# Patient Record
Sex: Female | Born: 1960
Health system: Southern US, Community
[De-identification: ages and names within clinical notes are randomized; demographics above are authoritative.]

## PROBLEM LIST (undated history)

## (undated) DIAGNOSIS — M1990 Unspecified osteoarthritis, unspecified site: Secondary | ICD-10-CM

## (undated) DIAGNOSIS — R1011 Right upper quadrant pain: Secondary | ICD-10-CM

## (undated) DIAGNOSIS — E785 Hyperlipidemia, unspecified: Secondary | ICD-10-CM

## (undated) DIAGNOSIS — T7840XA Allergy, unspecified, initial encounter: Secondary | ICD-10-CM

## (undated) DIAGNOSIS — H269 Unspecified cataract: Secondary | ICD-10-CM

## (undated) DIAGNOSIS — K589 Irritable bowel syndrome without diarrhea: Secondary | ICD-10-CM

## (undated) DIAGNOSIS — I10 Essential (primary) hypertension: Secondary | ICD-10-CM

## (undated) HISTORY — PX: TONSILLECTOMY: SUR1361

## (undated) HISTORY — DX: Essential (primary) hypertension: I10

## (undated) HISTORY — PX: POLYPECTOMY: SHX149

## (undated) HISTORY — PX: DILATION AND CURETTAGE OF UTERUS: SHX78

## (undated) HISTORY — DX: Unspecified osteoarthritis, unspecified site: M19.90

## (undated) HISTORY — DX: Hyperlipidemia, unspecified: E78.5

## (undated) HISTORY — DX: Irritable bowel syndrome, unspecified: K58.9

## (undated) HISTORY — PX: WISDOM TOOTH EXTRACTION: SHX21

## (undated) HISTORY — DX: Allergy, unspecified, initial encounter: T78.40XA

## (undated) HISTORY — PX: UPPER GASTROINTESTINAL ENDOSCOPY: SHX188

## (undated) HISTORY — DX: Right upper quadrant pain: R10.11

## (undated) HISTORY — DX: Unspecified cataract: H26.9

---

## 1997-11-20 ENCOUNTER — Ambulatory Visit (HOSPITAL_COMMUNITY): Admission: RE | Admit: 1997-11-20 | Discharge: 1997-11-20 | Payer: Self-pay | Admitting: Obstetrics and Gynecology

## 1998-04-29 ENCOUNTER — Other Ambulatory Visit: Admission: RE | Admit: 1998-04-29 | Discharge: 1998-04-29 | Payer: Self-pay | Admitting: Obstetrics and Gynecology

## 1999-09-24 ENCOUNTER — Other Ambulatory Visit: Admission: RE | Admit: 1999-09-24 | Discharge: 1999-09-24 | Payer: Self-pay | Admitting: Obstetrics and Gynecology

## 2000-03-11 ENCOUNTER — Encounter: Admission: RE | Admit: 2000-03-11 | Discharge: 2000-03-11 | Payer: Self-pay | Admitting: Gastroenterology

## 2000-03-11 ENCOUNTER — Encounter: Payer: Self-pay | Admitting: Gastroenterology

## 2000-03-11 ENCOUNTER — Encounter (INDEPENDENT_AMBULATORY_CARE_PROVIDER_SITE_OTHER): Payer: Self-pay | Admitting: *Deleted

## 2000-06-18 ENCOUNTER — Encounter: Admission: RE | Admit: 2000-06-18 | Discharge: 2000-06-18 | Payer: Self-pay | Admitting: Gastroenterology

## 2000-06-18 ENCOUNTER — Encounter (INDEPENDENT_AMBULATORY_CARE_PROVIDER_SITE_OTHER): Payer: Self-pay | Admitting: *Deleted

## 2000-06-18 ENCOUNTER — Encounter: Payer: Self-pay | Admitting: Gastroenterology

## 2000-08-04 ENCOUNTER — Encounter: Admission: RE | Admit: 2000-08-04 | Discharge: 2000-08-04 | Payer: Self-pay | Admitting: Gastroenterology

## 2000-08-04 ENCOUNTER — Encounter: Payer: Self-pay | Admitting: Gastroenterology

## 2001-08-24 ENCOUNTER — Ambulatory Visit (HOSPITAL_COMMUNITY): Admission: RE | Admit: 2001-08-24 | Discharge: 2001-08-24 | Payer: Self-pay | Admitting: Internal Medicine

## 2001-08-24 ENCOUNTER — Encounter: Payer: Self-pay | Admitting: Internal Medicine

## 2001-08-24 ENCOUNTER — Encounter (INDEPENDENT_AMBULATORY_CARE_PROVIDER_SITE_OTHER): Payer: Self-pay | Admitting: *Deleted

## 2003-10-25 ENCOUNTER — Other Ambulatory Visit: Admission: RE | Admit: 2003-10-25 | Discharge: 2003-10-25 | Payer: Self-pay | Admitting: Internal Medicine

## 2003-11-06 ENCOUNTER — Emergency Department (HOSPITAL_COMMUNITY): Admission: EM | Admit: 2003-11-06 | Discharge: 2003-11-07 | Payer: Self-pay | Admitting: Emergency Medicine

## 2005-01-20 ENCOUNTER — Other Ambulatory Visit: Admission: RE | Admit: 2005-01-20 | Discharge: 2005-01-20 | Payer: Self-pay | Admitting: Internal Medicine

## 2005-09-08 ENCOUNTER — Encounter: Payer: Self-pay | Admitting: Internal Medicine

## 2005-09-15 ENCOUNTER — Encounter (INDEPENDENT_AMBULATORY_CARE_PROVIDER_SITE_OTHER): Payer: Self-pay | Admitting: *Deleted

## 2005-09-15 ENCOUNTER — Encounter: Admission: RE | Admit: 2005-09-15 | Discharge: 2005-09-15 | Payer: Self-pay | Admitting: Internal Medicine

## 2005-09-18 ENCOUNTER — Encounter: Payer: Self-pay | Admitting: Internal Medicine

## 2006-03-24 ENCOUNTER — Other Ambulatory Visit: Admission: RE | Admit: 2006-03-24 | Discharge: 2006-03-24 | Payer: Self-pay | Admitting: Obstetrics and Gynecology

## 2006-04-02 ENCOUNTER — Ambulatory Visit: Payer: Self-pay | Admitting: Internal Medicine

## 2006-04-06 ENCOUNTER — Ambulatory Visit: Payer: Self-pay | Admitting: Internal Medicine

## 2006-04-27 ENCOUNTER — Ambulatory Visit: Payer: Self-pay | Admitting: Internal Medicine

## 2007-08-04 ENCOUNTER — Ambulatory Visit: Payer: Self-pay | Admitting: Cardiology

## 2007-08-25 ENCOUNTER — Ambulatory Visit: Payer: Self-pay | Admitting: Cardiology

## 2008-06-04 ENCOUNTER — Ambulatory Visit: Payer: Self-pay | Admitting: Cardiology

## 2009-09-06 ENCOUNTER — Telehealth: Payer: Self-pay | Admitting: Internal Medicine

## 2009-09-09 ENCOUNTER — Encounter (INDEPENDENT_AMBULATORY_CARE_PROVIDER_SITE_OTHER): Payer: Self-pay | Admitting: *Deleted

## 2009-09-26 DIAGNOSIS — Z8639 Personal history of other endocrine, nutritional and metabolic disease: Secondary | ICD-10-CM

## 2009-09-26 DIAGNOSIS — K589 Irritable bowel syndrome without diarrhea: Secondary | ICD-10-CM | POA: Insufficient documentation

## 2009-09-26 DIAGNOSIS — Z862 Personal history of diseases of the blood and blood-forming organs and certain disorders involving the immune mechanism: Secondary | ICD-10-CM | POA: Insufficient documentation

## 2010-05-22 HISTORY — PX: NOVASURE ABLATION: SHX5394

## 2010-05-23 ENCOUNTER — Ambulatory Visit (HOSPITAL_COMMUNITY)
Admission: RE | Admit: 2010-05-23 | Discharge: 2010-05-23 | Payer: Self-pay | Source: Home / Self Care | Attending: Obstetrics and Gynecology | Admitting: Obstetrics and Gynecology

## 2010-06-08 HISTORY — PX: ESOPHAGOGASTRODUODENOSCOPY: SHX1529

## 2010-07-10 NOTE — Letter (Signed)
Summary: New Patient letter  [facility]  [address] [place], [place] [address]   Phone: [phone]  Fax: [phone]       09/09/2009 MRN: [id number]  Helen Cook [address] [place], [place]  [address]  Dear Ms. Arrazola,  Welcome to the Gastroenterology Division at [facility].    You are scheduled to see Dr. Lina Sar on 10-02-09 at 2pm,  on the 3rd floor at [facility], [address].  We ask that you try to arrive at our office 15 minutes prior to your appointment time to allow for check-in.  We would like you to complete the enclosed self-administered evaluation form prior to your visit and bring it with you on the day of your appointment.  We will review it with you.  Also, please bring a complete list of all your medications or, if you prefer, bring the medication bottles and we will list them.  Please bring your insurance card so that we may make a copy of it.  If your insurance requires a referral to see a specialist, please bring your referral form from your primary care physician.  Co-payments are due at the time of your visit and may be paid by cash, check or credit card.     Your office visit will consist of a consult with your physician (includes a physical exam), any laboratory testing he/she may order, scheduling of any necessary diagnostic testing (e.g. x-ray, ultrasound, CT-scan), and scheduling of a procedure (e.g. Endoscopy, Colonoscopy) if required.  Please allow enough time on your schedule to allow for any/all of these possibilities.    If you cannot keep your appointment, please call [phone] to cancel or reschedule prior to your appointment date.  This allows [place] the opportunity to schedule an appointment for another patient in need of care.  If you do not cancel or reschedule by 5 p.m. the business day prior to your appointment date, you will be charged a $50.00 late cancellation/no-show fee.    Thank you for  choosing  Gastroenterology for your medical needs.  We appreciate the opportunity to care for you.  Please visit [place] at our website  to learn more about our practice.                     Sincerely,                                                             The Gastroenterology Division   Appended Document: New Patient letter Letter mailed to patient.

## 2010-07-10 NOTE — Progress Notes (Signed)
Summary: Triage  Phone Note Call from Patient Call back at Home Phone [phone]   Caller: Patient Call For: Dr. Juanda Chance Reason for Call: Talk to Nurse Summary of Call: Pt had a colonoscopy done in the early 90's and wants to know if Dr. Juanda Chance would recommend a repeat colon Initial call taken by: Karna Christmas,  September 06, 2009 2:05 PM  Follow-up for Phone Call        Message left for patient to callback. Laureen Ochs LPN  September 07, 9526 3:34 PM  Pt. is having pressure with her BM's, is becomming more frequent. Had  a Colonoscopy in the 90's at [facility], doesn't remember results. Pt. will see Dr.Kesleigh Morson on 10-02-09 at 2pm, she will  get a copy of her Colonoscopy and bring with her. Pt. instructed to call back as needed.  Follow-up by: Laureen Ochs LPN,  September 09, 2009 9:38 AM     Appended Document: Triage Pt. was "No Show" for above appt.

## 2010-08-18 LAB — CBC
HCT: 34.3 % — ABNORMAL LOW (ref 36.0–46.0)
MCH: 29.1 pg (ref 26.0–34.0)
MCV: 83.9 fL (ref 78.0–100.0)
Platelets: 163 10*3/uL (ref 150–400)
RDW: 12.4 % (ref 11.5–15.5)
WBC: 10.6 10*3/uL — ABNORMAL HIGH (ref 4.0–10.5)

## 2010-09-30 ENCOUNTER — Other Ambulatory Visit: Payer: Self-pay | Admitting: Gastroenterology

## 2010-10-21 NOTE — Assessment & Plan Note (Signed)
[facility]                            CARDIOLOGY OFFICE NOTE   JESSCIA, IMM                       MRN:          [id number]  DATE:06/04/2008                            DOB:          August 16, 1960    PRIMARY CARE PHYSICIAN:  Huel Cote, MD   REASON FOR PRESENTATION:  Evaluate the patient with chest discomfort.   HISTORY OF PRESENT ILLNESS:  I last saw the patient in March at the time  of her treadmill to evaluate chest discomfort.  She had negative  adequate result.  There was no indication that her discomfort was from a  cardiac etiology.  Since that time, she has continued to have sporadic  chest discomfort as described in the February note.  It does not have an  increased frequency or intensity.  It happens out of the blue.  Unfortunately, she never started exercising, in fact is exercising less  than she was previously, as her daughter and 2 young grandchildren moved  in with her and is taking quite bit of her time.  She still occasionally  describes some right arm tingling and also tingling in her right leg at  night.  However, since getting a new mattress this actually seems to  have improved.   PAST MEDICAL HISTORY:  Low HDL and tonsillectomy.   ALLERGIES:  CODEINE.   MEDICATIONS:  Primrose oil, B complex, and flaxseed oil.   REVIEW OF SYSTEMS:  As stated in the HPI and otherwise negative for  other systems.   PHYSICAL EXAMINATION:  GENERAL:  The patient is in no distress.  VITAL SIGNS:  Blood pressure 128/82, heart rate 88 and regular, weight  144 pounds, body mass index 24.  HEENT:  Eyelids unremarkable.  Pupils equal, round, and reactive to  light.  Fundi not visualized, oral mucosa unremarkable.  NECK:  No jugular venous distention at 45 degrees.  Carotid upstroke  brisk and symmetric.  No bruits and no thyromegaly.  LYMPHATICS:  No  cervical, axillary, or inguinal adenopathy.  LUNGS:  Clear to auscultation bilaterally.  BACK:  No costovertebral angle tenderness.  CHEST:  Unremarkable.  HEART:  PMI not displaced or sustained.  S1 and S2 within normal.  No  S3, no S4, no clicks, no rubs, and no murmurs.  ABDOMEN:  Flat, positive bowel sounds.  Normal in frequency and pitch.  No bruits, no rebound, no guarding, and no midline pulsatile mass, no  organomegaly.  EXTREMITIES:  2+ pulse, no edema.   EKG sinus rhythm, rate 88.  Axis within normal limits.  Intervals within  normal limits.  No acute ST-wave changes.   ASSESSMENT AND PLAN:  1. Chest discomfort.  This does not seem to have cardiac etiology.      Sporadic and not particularly, bothersome.  At this point, no      further cardiovascular testing is suggested.  2. Followup.  She can come back to this clinic as needed.     Rollene Rotunda, MD, [facility]  Electronically Signed    JH/MedQ  DD: 06/04/2008  DT: 06/05/2008  Job #: [id number]  cc:   Huel Cote, M.D.

## 2010-10-21 NOTE — Assessment & Plan Note (Signed)
[facility]                            CARDIOLOGY OFFICE NOTE   Helen Cook, Helen Cook                       MRN:          [id number]  DATE:08/04/2007                            DOB:          1961/04/21    PRIMARY CARE PHYSICIAN:  Helen Cook, M.D.   REASON FOR CONSULTATION:  Evaluate patient with chest discomfort.   HISTORY OF PRESENT ILLNESS:  The patient is a pleasant 50 year old white  female without prior cardiac history.  She noticed over several weeks,  chest heaviness.  This is with activity such as walking.  It is a 4/10  discomfort.  She will be walking at a brisk pace.  She notices it kind  of like an ache.  She was ignoring this.  She stopped what she was doing  and it would go away.  There was initially no jaw or arm discomfort.  However, more recently the last few times she has walked, she has had a  little tingling in her right arm.  She also notices the same discomfort  in her chest if she lies flat.  She does not report any shortness of  breath and has no PND or orthopnea.  She is not having any palpitations,  presyncope or syncope.  She is an active woman.  She does not notice  these symptoms otherwise at rest other than described above.  She saw  Dr. Senaida Cook who referred her for consultation.   PAST MEDICAL HISTORY:  She has no history of hypertension, diabetes or  hyperlipidemia.  There is a mention of a low HDL.   PAST SURGICAL HISTORY:  1. Childbirth.  2. Tonsillectomy.   ALLERGIES:  CODEINE.   MEDICATIONS:  Multivitamin, B stress vitamin.   SOCIAL HISTORY:  The patient takes care of rental property.  She is  married.  She had two children.  She never smoked cigarettes.  She does  not drink alcohol.   FAMILY HISTORY:  Noncontributory for early coronary artery disease.   REVIEW OF SYSTEMS:  Positive occasional headaches, some low back pain.  Negative for all other systems.   PHYSICAL EXAMINATION:  GENERAL  APPEARANCE:  The patient is well-  appearing and in no distress.  VITAL SIGNS:  Blood pressure 137/79, heart rate 92 and regular, weight  140 pounds, body mass index 24.  HEENT:  Eyelids are unremarkable. pupils equal and reactive to light.  Fundi not visualized.  Oral mucosa unremarkable.  NECK:  No jugular venous distension at 45 degrees, carotid upstroke  brisk and symmetric.  No bruits, no thyromegaly.  LYMPHATICS:  No cervical, axillary, inguinal adenopathy.  LUNGS:  Clear to auscultation bilaterally.  BACK:  No costovertebral angle tenderness.  CHEST:  Unremarkable.  HEART:  PMI not displaced or sustained, S1-S2 within normal.  No S3-S4,  no clicks, no rubs, no murmurs.  ABDOMEN:  Flat, positive bowel sounds, normal in frequency and pitch, no  bruits, no rebound, no guarding or midline pulsatile mass.  No  hepatomegaly or splenomegaly.  SKIN:  No rashes, no nodules.  EXTREMITIES:  2+ pulses throughout, no edema,  cyanosis or clubbing.  NEURO:  Oriented to person, place and time, cranial nerves II-XII  grossly intact, motor grossly intact throughout.   EKG:  Sinus rhythm, rate 98, axis within normal limits, intervals within  normal limits, no acute ST-T wave change.  There is early transition  lead V2.   ASSESSMENT/PLAN:  1. Chest discomfort:  The patient's chest, somewhat atypical.      However, there is some features that are slightly worrisome.  She      has no significant risk factors other than low HDL by report.  I do      not have these labs.  I think the pretest probability of      obstructive coronary disease is low.  However, screening with an      exercise treadmill test would be warranted.  This will allow me to      rule out obstructive coronary disease.  More importantly, I will be      able to risk stratify.  Most importantly, give her a prescription      for exercise since she is not doing this routinely.  She will come      back to have a POET (plain old  exercise treadmill).  2. Risk reduction:  I will check a lipid profile since it has been a      while.  She will have this when she comes back fasting.  3. Follow-up:  I will see her at the time of the stress test.     Helen Rotunda, MD, [facility]  Electronically Signed    JH/MedQ  DD: 08/04/2007  DT: 08/05/2007  Job #: [id number]   cc:   Helen Cook, M.D.

## 2010-10-21 NOTE — Procedures (Signed)
[facility]                              EXERCISE DESIRAE, MANCUSI                       MRN:          [id number]  DATE:08/25/2007                            DOB:          Jan 04, 1961    PRIMARY CARE PHYSICIAN:  Huel Cote, M.D.   PROCEED:  Exercise treadmill test.   INDICATIONS:  Evaluate patient with chest pain.   PROCEDURE NOTE:  The patient was exercised using standard Bruce  protocol.  She was able to exercise for 10 minutes.  This was one minute  into stage IV.  Test was terminated because she achieved a target heart  rate and because of fatigue.  She had no chest pain.  She had  appropriate dyspnea.  Her peak heart rate was 164 which was 95% of  predicted.  She achieved 10.0 mets.  Repeat blood pressure was 191/85.  She had no arrhythmias.  No ischemic ST-T wave changes.  She had a  normal heart rate recovery.   CONCLUSION:  Negative adequate exercise treadmill test with reasonable  exercise tolerance.  There is no evidence for high-grade obstructive  coronary disease.   PLAN:  Based on the above, no further cardiovascular testing is  suggested.  I did discuss with the patient an exercise regimen based on  this.  We discussed diet as well.  No further follow-up is warranted  unless she has symptoms going forward.     Rollene Rotunda, MD, [facility]  Electronically Signed    JH/MedQ  DD: 08/25/2007  DT: 08/26/2007  Job #: [id number]   cc:   Huel Cote, M.D.

## 2010-10-24 NOTE — Assessment & Plan Note (Signed)
[facility]                           GASTROENTEROLOGY OFFICE NOTE   Helen Cook, Helen Cook                       MRN:          [id number]  DATE:04/02/2006                            DOB:          02-29-1961    Helen Cook is a very nice 50 year old black female who is for evaluation  of abnormal liver function tests.  On September 08, 2005, her AST was 45, ALT was  78 with normal alkaline phosphatase and serum albumin.  On September 18, 2005,  AST was 51, ALT 73 with normal alkaline phosphatase.  Her hepatitis A, B, C  antibodies and serologies have been negative.  Patient gives a history of  hemochromatosis in her family.  Her mother's mother's brother and three  sisters had hemachromatosis.  Patient's mother herself has never been tested  for such.  Helen Cook herself has been having right upper quadrant  abdominal pain.  She has never received blood transfusions and has never  been a blood donor.  She does not drink any alcohol.  She denies any history  of jaundice.  She denies easy bruising or swelling in her feet, but her  level of energy has been somewhat decreased.  She has a history of irritable  bowel syndrome.  About a year ago, she discontinued all of her multivitamins  and iron because of her concern for hemachromatosis.  She is currently on no  medication.   PAST MEDICAL HISTORY:  Significant for irritable bowel syndrome.   OPERATIONS:  None.   FAMILY HISTORY:  As mentioned in the history of present illness.  Diabetes  in grandparents.  Breast cancer, mother's side.   SOCIAL HISTORY:  She is married.  Has two children.  She does not smoke,  does not drink alcohol.   REVIEW OF SYSTEMS:  Positive for eye glasses.  Pain with periods.  Leakage  of urine.   PHYSICAL EXAMINATION:  VITAL SIGNS:  Blood pressure 118/62, pulse 96.  Weight 136 pounds.  GENERAL:  She was alert and oriented in no distress.  HEENT:  Sclerae are anicteric.  No  palmar erythema or Dupuytren  contractures.  No spider nevi.  NECK:  Supple without adenopathy.  LUNGS:  Clear to auscultation.  COR:  Normal S1 and S2.  There was a 2/6 systolic ejection murmur not  radiating to the carotids.  ABDOMEN:  Soft with tenderness in the subxiphoid area.  Liver edge was at  the costal margin.  By percussion was about 8-9 cm in span.  No  hepatomegaly.  No splenomegaly.  No ascites.  No venous distention of the  abdominal veins.  The lower abdomen was normal.  EXTREMITIES:  No edema.  NEUROLOGIC:  No asterixis.   IMPRESSION:  A 50 year old white female with a strong family history of  hemachromatosis, now with mild elevation of transaminases.  She had a pelvic  ultrasound and was told to have fatty liver, but the official report of the  pelvic ultrasound does not mention any liver appearance.   PLAN:  1. Upper abdominal ultrasound with attention to the liver.  2. Serum ferritin, TIBC and serum iron levels.  Alpha fetoprotein.  HLA      typing for hemochromatosis.  We will also obtain the prothrombin time.      After all this is done, patient will come back to discuss with [place].     Helen Morton. Juanda Chance, MD    DMB/MedQ  DD: 04/02/2006  DT: 04/03/2006  Job #: [id number]   cc:   Marcene Duos, M.D.

## 2011-01-01 ENCOUNTER — Other Ambulatory Visit: Payer: Self-pay | Admitting: Gastroenterology

## 2011-01-01 DIAGNOSIS — R1011 Right upper quadrant pain: Secondary | ICD-10-CM

## 2011-01-02 ENCOUNTER — Other Ambulatory Visit: Payer: Self-pay

## 2011-01-05 ENCOUNTER — Other Ambulatory Visit: Payer: Self-pay

## 2011-01-06 ENCOUNTER — Ambulatory Visit
Admission: RE | Admit: 2011-01-06 | Discharge: 2011-01-06 | Disposition: A | Discharge status: Home / Self Care | Payer: 59 | Source: Ambulatory Visit | Attending: Gastroenterology | Admitting: Gastroenterology

## 2011-01-06 DIAGNOSIS — R1011 Right upper quadrant pain: Secondary | ICD-10-CM

## 2011-02-25 ENCOUNTER — Other Ambulatory Visit (HOSPITAL_COMMUNITY): Payer: Self-pay | Admitting: Gastroenterology

## 2011-03-12 ENCOUNTER — Other Ambulatory Visit (HOSPITAL_COMMUNITY): Payer: 59

## 2011-03-24 ENCOUNTER — Encounter (HOSPITAL_COMMUNITY)
Admission: RE | Admit: 2011-03-24 | Discharge: 2011-03-24 | Disposition: A | Discharge status: Home / Self Care | Payer: 59 | Source: Ambulatory Visit | Attending: Gastroenterology | Admitting: Gastroenterology

## 2011-03-24 DIAGNOSIS — R109 Unspecified abdominal pain: Secondary | ICD-10-CM | POA: Insufficient documentation

## 2011-03-24 MED ORDER — TECHNETIUM TC 99M MEBROFENIN IV KIT
5.0000 | PACK | Freq: Once | INTRAVENOUS | Status: AC | PRN
  Administered 2011-03-24: 5 via INTRAVENOUS

## 2011-03-31 ENCOUNTER — Ambulatory Visit (INDEPENDENT_AMBULATORY_CARE_PROVIDER_SITE_OTHER): Payer: 59 | Admitting: General Surgery

## 2011-03-31 ENCOUNTER — Encounter (INDEPENDENT_AMBULATORY_CARE_PROVIDER_SITE_OTHER): Payer: Self-pay | Admitting: General Surgery

## 2011-03-31 VITALS — BP 112/68 | HR 80 | Temp 97.1°F | Resp 20 | Ht 64.0 in | Wt 134.0 lb

## 2011-03-31 DIAGNOSIS — R1011 Right upper quadrant pain: Secondary | ICD-10-CM

## 2011-03-31 NOTE — Patient Instructions (Signed)
If you have any question or would like to schedule the operation please call back.

## 2011-03-31 NOTE — Progress Notes (Signed)
Chief Complaint  Patient presents with  . New Evaluation    new pt eval of RUQ pain     HPI Helen Cook is a 50 y.o. female.  HPI She is referred by Dr. Dulce Sellar for possible cholecystectomy.  She has been having intermittent RUQ abdominal pain for about 6 months.  She has some nausea and has had one episode of vomiting.  Upper endoscopy was unrevealing.  Abdominal US demostrated no gallstones and finding suspicious for steatosis.  NM hepatobiliary scan demonstrated a GBEF of 68% which is within normal limits.  There are no specific exacerbating events.  She does have irritable bowel syndrome. Past Medical History  Diagnosis Date  . Abdominal pain, RUQ     RUQ and also radiates to the center of abdomen   . IBS (irritable bowel syndrome)     Past Surgical History  Procedure Date  . Novasure ablation 05/22/10  . Tonsillectomy   . Wisdom tooth extraction     History reviewed. No pertinent family history.  Social History History  Substance Use Topics  . Smoking status: Never Smoker   . Smokeless tobacco: Never Used  . Alcohol Use: No    Allergies  Allergen Reactions  . Codeine     Hives/ itching red bumps  . Penicillins     Current Outpatient Prescriptions  Medication Sig Dispense Refill  . Calcium Carbonate-Vitamin D (CALCIUM + D PO) Take by mouth daily.        . Multiple Vitamin (MULTI-VITAMIN DAILY PO) Take by mouth daily.          Review of Systems Review of Systems  Blood pressure 112/68, pulse 80, temperature 97.1 F (36.2 C), resp. rate 20, height 5\' 4"  (1.626 m), weight 134 lb (60.782 kg).  Physical Exam Physical Exam  Data Reviewed Labs (LFT nl), [place], NMHB scan, EGD, Dr. Hulen Shouts notes. Assessment    RUQ pain with normal gallbladder objective studies and IBS.  At times, her sxs are suggestive of gallbladder disease, although there is no way to predict if a cholecystectomy will improve her sxs.  This could possibly be an atypical manifestation of her  IBS.    Plan    I had a long discussion with her about this issue. We discussed the fact that a cholecystectomy could be diagnostic for diagnostic and therapeutic. However I could not give her probability of it relieving her symptoms.  I did discuss cholecystectomy with her and explained what the gallbladder did and gave her literature on this.  I have explained the procedure, risks, and aftercare of cholecystectomy.  Risks include but are not limited to bleeding, infection, wound problems, anesthesia, diarrhea, bile leak, injury to common bile duct/liver/intestine.  She seems to understand all of this.    She wants to go home and speak with her husband and I told her to call back if she is interested in scheduling the operation or if she had further questions.        Maylon Sailors J 03/31/2011, 2:07 PM

## 2012-03-18 ENCOUNTER — Ambulatory Visit: Payer: Self-pay

## 2014-11-02 ENCOUNTER — Encounter (HOSPITAL_COMMUNITY): Payer: Self-pay | Admitting: *Deleted

## 2014-11-02 ENCOUNTER — Emergency Department (HOSPITAL_COMMUNITY): Payer: 59

## 2014-11-02 ENCOUNTER — Emergency Department (HOSPITAL_COMMUNITY)
Admission: EM | Admit: 2014-11-02 | Discharge: 2014-11-03 | Disposition: A | Discharge status: Home / Self Care | Payer: 59 | Attending: Emergency Medicine | Admitting: Emergency Medicine

## 2014-11-02 DIAGNOSIS — K5732 Diverticulitis of large intestine without perforation or abscess without bleeding: Secondary | ICD-10-CM | POA: Insufficient documentation

## 2014-11-02 DIAGNOSIS — Z880 Allergy status to penicillin: Secondary | ICD-10-CM | POA: Diagnosis not present

## 2014-11-02 DIAGNOSIS — Z79899 Other long term (current) drug therapy: Secondary | ICD-10-CM | POA: Diagnosis not present

## 2014-11-02 DIAGNOSIS — R109 Unspecified abdominal pain: Secondary | ICD-10-CM

## 2014-11-02 DIAGNOSIS — N200 Calculus of kidney: Secondary | ICD-10-CM | POA: Diagnosis not present

## 2014-11-02 DIAGNOSIS — R319 Hematuria, unspecified: Secondary | ICD-10-CM | POA: Diagnosis present

## 2014-11-02 LAB — URINALYSIS, ROUTINE W REFLEX MICROSCOPIC
BILIRUBIN URINE: NEGATIVE
Glucose, UA: NEGATIVE mg/dL
Hgb urine dipstick: NEGATIVE
KETONES UR: NEGATIVE mg/dL
NITRITE: NEGATIVE
PROTEIN: NEGATIVE mg/dL
Specific Gravity, Urine: 1.017 (ref 1.005–1.030)
Urobilinogen, UA: 0.2 mg/dL (ref 0.0–1.0)
pH: 7 (ref 5.0–8.0)

## 2014-11-02 LAB — CBC WITH DIFFERENTIAL/PLATELET
Basophils Absolute: 0 10*3/uL (ref 0.0–0.1)
Basophils Relative: 0 % (ref 0–1)
EOS ABS: 0 10*3/uL (ref 0.0–0.7)
Eosinophils Relative: 1 % (ref 0–5)
HEMATOCRIT: 36.7 % (ref 36.0–46.0)
Hemoglobin: 12.4 g/dL (ref 12.0–15.0)
LYMPHS ABS: 2 10*3/uL (ref 0.7–4.0)
LYMPHS PCT: 23 % (ref 12–46)
MCH: 29.2 pg (ref 26.0–34.0)
MCHC: 33.8 g/dL (ref 30.0–36.0)
MCV: 86.6 fL (ref 78.0–100.0)
MONO ABS: 0.4 10*3/uL (ref 0.1–1.0)
Monocytes Relative: 5 % (ref 3–12)
NEUTROS ABS: 6.2 10*3/uL (ref 1.7–7.7)
Neutrophils Relative %: 71 % (ref 43–77)
Platelets: 181 10*3/uL (ref 150–400)
RBC: 4.24 MIL/uL (ref 3.87–5.11)
RDW: 12.4 % (ref 11.5–15.5)
WBC: 8.7 10*3/uL (ref 4.0–10.5)

## 2014-11-02 LAB — BASIC METABOLIC PANEL
Anion gap: 9 (ref 5–15)
BUN: 7 mg/dL (ref 6–20)
CHLORIDE: 104 mmol/L (ref 101–111)
CO2: 28 mmol/L (ref 22–32)
Calcium: 9.4 mg/dL (ref 8.9–10.3)
Creatinine, Ser: 0.65 mg/dL (ref 0.44–1.00)
GFR calc non Af Amer: >60 mL/min (ref >60)
GLUCOSE: 118 mg/dL — AB (ref 65–99)
POTASSIUM: 3.9 mmol/L (ref 3.5–5.1)
Sodium: 141 mmol/L (ref 135–145)

## 2014-11-02 LAB — URINE MICROSCOPIC-ADD ON

## 2014-11-02 MED ORDER — CIPROFLOXACIN HCL 500 MG PO TABS
500.0000 mg | ORAL_TABLET | Freq: Once | ORAL | Status: AC
  Administered 2014-11-03: 500 mg via ORAL
  Filled 2014-11-02: qty 1

## 2014-11-02 MED ORDER — HYDROCODONE-ACETAMINOPHEN 5-325 MG PO TABS: 1.0000 | ORAL_TABLET | Freq: Four times a day (QID) | ORAL | Status: DC | PRN

## 2014-11-02 MED ORDER — METRONIDAZOLE 500 MG PO TABS: 500.0000 mg | ORAL_TABLET | Freq: Three times a day (TID) | ORAL | Status: DC

## 2014-11-02 MED ORDER — CIPROFLOXACIN HCL 500 MG PO TABS: 500.0000 mg | ORAL_TABLET | Freq: Two times a day (BID) | ORAL | Status: DC

## 2014-11-02 MED ORDER — IBUPROFEN 400 MG PO TABS
400.0000 mg | ORAL_TABLET | Freq: Once | ORAL | Status: AC
  Administered 2014-11-03: 400 mg via ORAL
  Filled 2014-11-02: qty 1

## 2014-11-02 MED ORDER — METRONIDAZOLE 500 MG PO TABS
500.0000 mg | ORAL_TABLET | Freq: Once | ORAL | Status: AC
  Administered 2014-11-03: 500 mg via ORAL
  Filled 2014-11-02: qty 1

## 2014-11-02 NOTE — ED Notes (Signed)
To ct

## 2014-11-02 NOTE — ED Notes (Signed)
States she is hurting in the lower abd and lower back

## 2014-11-02 NOTE — ED Notes (Signed)
The pt ius here with abd pain and painful urination while at work.  She was seen by ob doctor and was told she did not have a uti.  Mire pain tonight with some bleeding

## 2014-11-02 NOTE — Discharge Instructions (Signed)
It was our pleasure to provide your ER care today - we hope that you feel better.  Your ct scan was read as follows: IMPRESSION: 1. Uncomplicated diverticulitis at the descending sigmoid junction. 2. Possible cystitis. Correlate with urinalysis. 3. Numerous small bilateral renal calculi.  For diverticulitis, take antibiotics (cipro and flagyl) as prescribed.  Do not drink alcohol when taking flagyl.  Take motrin or aleve as need for pain. You may also take hydrocodone as need for pain. No driving when taking hydrocodone. Also, do not take tylenol or acetaminophen containing medication when taking hydrocodone.  Follow up with urologist as planned.  Return to ER if worse, new symptoms, severe pain, persistent vomiting, high fevers, weak/faint, other concern.       Diverticulitis Diverticulitis is inflammation or infection of small pouches in your colon that form when you have a condition called diverticulosis. The pouches in your colon are called diverticula. Your colon, or large intestine, is where water is absorbed and stool is formed. Complications of diverticulitis can include:  Bleeding.  Severe infection.  Severe pain.  Perforation of your colon.  Obstruction of your colon. CAUSES  Diverticulitis is caused by bacteria. Diverticulitis happens when stool becomes trapped in diverticula. This allows bacteria to grow in the diverticula, which can lead to inflammation and infection. RISK FACTORS People with diverticulosis are at risk for diverticulitis. Eating a diet that does not include enough fiber from fruits and vegetables may make diverticulitis more likely to develop. SYMPTOMS  Symptoms of diverticulitis may include:  Abdominal pain and tenderness. The pain is normally located on the left side of the abdomen, but may occur in other areas.  Fever and chills.  Bloating.  Cramping.  Nausea.  Vomiting.  Constipation.  Diarrhea.  Blood in your  stool. DIAGNOSIS  Your health care provider will ask you about your medical history and do a physical exam. You may need to have tests done because many medical conditions can cause the same symptoms as diverticulitis. Tests may include:  Blood tests.  Urine tests.  Imaging tests of the abdomen, including X-rays and CT scans. When your condition is under control, your health care provider may recommend that you have a colonoscopy. A colonoscopy can show how severe your diverticula are and whether something else is causing your symptoms. TREATMENT  Most cases of diverticulitis are mild and can be treated at home. Treatment may include:  Taking over-the-counter pain medicines.  Following a clear liquid diet.  Taking antibiotic medicines by mouth for 7-10 days. More severe cases may be treated at a hospital. Treatment may include:  Not eating or drinking.  Taking prescription pain medicine.  Receiving antibiotic medicines through an IV tube.  Receiving fluids and nutrition through an IV tube.  Surgery. HOME CARE INSTRUCTIONS   Follow your health care provider's instructions carefully.  Follow a full liquid diet or other diet as directed by your health care provider. After your symptoms improve, your health care provider may tell you to change your diet. He or she may recommend you eat a high-fiber diet. Fruits and vegetables are good sources of fiber. Fiber makes it easier to pass stool.  Take fiber supplements or probiotics as directed by your health care provider.  Only take medicines as directed by your health care provider.  Keep all your follow-up appointments. SEEK MEDICAL CARE IF:   Your pain does not improve.  You have a hard time eating food.  Your bowel movements do not return to  normal. SEEK IMMEDIATE MEDICAL CARE IF:   Your pain becomes worse.  Your symptoms do not get better.  Your symptoms suddenly get worse.  You have a fever.  You have repeated  vomiting.  You have bloody or black, tarry stools. MAKE SURE YOU:   Understand these instructions.  Will watch your condition.  Will get help right away if you are not doing well or get worse. Document Released: 03/04/2005 Document Revised: 05/30/2013 Document Reviewed: 04/19/2013 [facility] Patient Information 2015 [place], [place]. This information is not intended to replace advice given to you by your health care provider. Make sure you discuss any questions you have with your health care provider.     Kidney Stones Kidney stones (urolithiasis) are deposits that form inside your kidneys. The intense pain is caused by the stone moving through the urinary tract. When the stone moves, the ureter goes into spasm around the stone. The stone is usually passed in the urine.  CAUSES   A disorder that makes certain neck glands produce too much parathyroid hormone (primary hyperparathyroidism).  A buildup of uric acid crystals, similar to gout in your joints.  Narrowing (stricture) of the ureter.  A kidney obstruction present at birth (congenital obstruction).  Previous surgery on the kidney or ureters.  Numerous kidney infections. SYMPTOMS   Feeling sick to your stomach (nauseous).  Throwing up (vomiting).  Blood in the urine (hematuria).  Pain that usually spreads (radiates) to the groin.  Frequency or urgency of urination. DIAGNOSIS   Taking a history and physical exam.  Blood or urine tests.  CT scan.  Occasionally, an examination of the inside of the urinary bladder (cystoscopy) is performed. TREATMENT   Observation.  Increasing your fluid intake.  Extracorporeal shock wave lithotripsy--This is a noninvasive procedure that uses shock waves to break up kidney stones.  Surgery may be needed if you have severe pain or persistent obstruction. There are various surgical procedures. Most of the procedures are performed with the use of small instruments. Only small  incisions are needed to accommodate these instruments, so recovery time is minimized. The size, location, and chemical composition are all important variables that will determine the proper choice of action for you. Talk to your health care provider to better understand your situation so that you will minimize the risk of injury to yourself and your kidney.  HOME CARE INSTRUCTIONS   Drink enough water and fluids to keep your urine clear or pale yellow. This will help you to pass the stone or stone fragments.  Strain all urine through the provided strainer. Keep all particulate matter and stones for your health care provider to see. The stone causing the pain may be as small as a grain of salt. It is very important to use the strainer each and every time you pass your urine. The collection of your stone will allow your health care provider to analyze it and verify that a stone has actually passed. The stone analysis will often identify what you can do to reduce the incidence of recurrences.  Only take over-the-counter or prescription medicines for pain, discomfort, or fever as directed by your health care provider.  Make a follow-up appointment with your health care provider as directed.  Get follow-up X-rays if required. The absence of pain does not always mean that the stone has passed. It may have only stopped moving. If the urine remains completely obstructed, it can cause loss of kidney function or even complete destruction of the kidney.  It is your responsibility to make sure X-rays and follow-ups are completed. Ultrasounds of the kidney can show blockages and the status of the kidney. Ultrasounds are not associated with any radiation and can be performed easily in a matter of minutes. SEEK MEDICAL CARE IF:  You experience pain that is progressive and unresponsive to any pain medicine you have been prescribed. SEEK IMMEDIATE MEDICAL CARE IF:   Pain cannot be controlled with the prescribed  medicine.  You have a fever or shaking chills.  The severity or intensity of pain increases over 18 hours and is not relieved by pain medicine.  You develop a new onset of abdominal pain.  You feel faint or pass out.  You are unable to urinate. MAKE SURE YOU:   Understand these instructions.  Will watch your condition.  Will get help right away if you are not doing well or get worse. Document Released: 05/25/2005 Document Revised: 01/25/2013 Document Reviewed: 10/26/2012 [facility] Patient Information 2015 [place], [place]. This information is not intended to replace advice given to you by your health care provider. Make sure you discuss any questions you have with your health care provider.

## 2014-11-02 NOTE — ED Provider Notes (Addendum)
CSN: [id number]     Arrival date & time 11/02/14  1933 History   First MD Initiated Contact with Patient 11/02/14 2124     Chief Complaint  Patient presents with  . Hematuria     (Consider location/radiation/quality/duration/timing/severity/associated sxs/prior Treatment) Patient is a 54 y.o. female presenting with hematuria. The history is provided by the patient.  Hematuria Pertinent negatives include no chest pain, no abdominal pain, no headaches and no shortness of breath.  Patient c/o recent hematuria, and today acute onset pain left flank post urinating, urinary urgency.  Denies dysuria. No fever or chills. No nv. Normal appetite.  Pt indicates had recently seen her gyn doctor w hematuria.  States initially started on abx, but then called and told urine culture was negative and to stop abx.  Pt states was then referred to urology follow up.  No prior hx hematuria or kidney stones. Pt w acute onset flank pain at evening at work. Also c/o pain posteriorly. Denies hx same pain. Normal appetite, normal po intake. Has been having normal bms. Hx ibs, states current symptoms different. Pt states post menopausal, lnmp 4 yrs ago. Denies fever or chills. No vaginal discharge or bleeding.       Past Medical History  Diagnosis Date  . Abdominal pain, RUQ     RUQ and also radiates to the center of abdomen   . IBS (irritable bowel syndrome)    Past Surgical History  Procedure Laterality Date  . Novasure ablation  05/22/10  . Tonsillectomy    . Wisdom tooth extraction     No family history on file. History  Substance Use Topics  . Smoking status: Never Smoker   . Smokeless tobacco: Never Used  . Alcohol Use: No   OB History    No data available     Review of Systems  Constitutional: Negative for fever and chills.  HENT: Negative for sore throat.   Eyes: Negative for redness.  Respiratory: Negative for shortness of breath.   Cardiovascular: Negative for chest pain.   Gastrointestinal: Negative for vomiting, abdominal pain and diarrhea.  Endocrine: Negative for polyuria.  Genitourinary: Positive for hematuria and flank pain. Negative for dysuria.  Musculoskeletal: Positive for back pain. Negative for neck pain.  Skin: Negative for rash.  Neurological: Negative for headaches.  Hematological: Does not bruise/bleed easily.  Psychiatric/Behavioral: Negative for confusion.      Allergies  Codeine and Penicillins  Home Medications   Prior to Admission medications   Medication Sig Start Date End Date Taking? Authorizing Provider  Calcium Carbonate-Vitamin D (CALCIUM + D PO) Take 1 tablet by mouth daily.    Yes Historical Provider, MD  Multiple Vitamin (MULTI-VITAMIN DAILY PO) Take 1 tablet by mouth daily.    Yes Historical Provider, MD   BP 124/69 mmHg  Pulse 82  Temp(Src) 98.7 F (37.1 C) (Oral)  Resp 20  Ht 5\' 4"  (1.626 m)  Wt 130 lb (58.968 kg)  BMI 22.30 kg/m2  SpO2 100% Physical Exam  Constitutional: She appears well-developed and well-nourished. No distress.  HENT:  Mouth/Throat: Oropharynx is clear and moist.  Eyes: Conjunctivae are normal. No scleral icterus.  Neck: Neck supple. No tracheal deviation present.  Cardiovascular: Normal rate, regular rhythm, normal heart sounds and intact distal pulses.   Pulmonary/Chest: Effort normal and breath sounds normal. No respiratory distress.  Abdominal: Soft. Normal appearance and bowel sounds are normal. She exhibits no distension and no mass. There is tenderness. There is no rebound and  no guarding.  No hernia. Mild llq tenderness.   Genitourinary:  No cva tenderness  Musculoskeletal: She exhibits no edema.  Neurological: She is alert.  Skin: Skin is warm and dry. No rash noted. She is not diaphoretic.  No shingles/rash in area of pain  Psychiatric: She has a normal mood and affect.  Nursing note and vitals reviewed.   ED Course  Procedures (including critical care time) Labs  Review   Results for orders placed or performed during the hospital encounter of 11/02/14  Urinalysis, Routine w reflex microscopic (not at [facility])  Result Value Ref Range   Color, Urine AMBER (A) YELLOW   APPearance CLEAR CLEAR   Specific Gravity, Urine 1.017 1.005 - 1.030   pH 7.0 5.0 - 8.0   Glucose, UA NEGATIVE NEGATIVE mg/dL   Hgb urine dipstick NEGATIVE NEGATIVE   Bilirubin Urine NEGATIVE NEGATIVE   Ketones, ur NEGATIVE NEGATIVE mg/dL   Protein, ur NEGATIVE NEGATIVE mg/dL   Urobilinogen, UA 0.2 0.0 - 1.0 mg/dL   Nitrite NEGATIVE NEGATIVE   Leukocytes, UA SMALL (A) NEGATIVE  CBC with Differential  Result Value Ref Range   WBC 8.7 4.0 - 10.5 K/uL   RBC 4.24 3.87 - 5.11 MIL/uL   Hemoglobin 12.4 12.0 - 15.0 g/dL   HCT 36.7 36.0 - 46.0 %   MCV 86.6 78.0 - 100.0 fL   MCH 29.2 26.0 - 34.0 pg   MCHC 33.8 30.0 - 36.0 g/dL   RDW 12.4 11.5 - 15.5 %   Platelets 181 150 - 400 K/uL   Neutrophils Relative % 71 43 - 77 %   Neutro Abs 6.2 1.7 - 7.7 K/uL   Lymphocytes Relative 23 12 - 46 %   Lymphs Abs 2.0 0.7 - 4.0 K/uL   Monocytes Relative 5 3 - 12 %   Monocytes Absolute 0.4 0.1 - 1.0 K/uL   Eosinophils Relative 1 0 - 5 %   Eosinophils Absolute 0.0 0.0 - 0.7 K/uL   Basophils Relative 0 0 - 1 %   Basophils Absolute 0.0 0.0 - 0.1 K/uL  Basic metabolic panel  Result Value Ref Range   Sodium 141 135 - 145 mmol/L   Potassium 3.9 3.5 - 5.1 mmol/L   Chloride 104 101 - 111 mmol/L   CO2 28 22 - 32 mmol/L   Glucose, Bld 118 (H) 65 - 99 mg/dL   BUN 7 6 - 20 mg/dL   Creatinine, Ser 0.65 0.44 - 1.00 mg/dL   Calcium 9.4 8.9 - 10.3 mg/dL   GFR calc non Af Amer >60 >60 mL/min   GFR calc Af Amer >60 >60 mL/min   Anion gap 9 5 - 15  Urine microscopic-add on  Result Value Ref Range   Squamous Epithelial / LPF RARE RARE   WBC, UA 3-6 <3 WBC/hpf   Bacteria, UA FEW (A) RARE   Urine-Other MUCOUS PRESENT    Ct Abdomen Pelvis Wo Contrast  11/02/2014   CLINICAL DATA:  Groin and lower back pain  for 2 months  EXAM: CT ABDOMEN AND PELVIS WITHOUT CONTRAST  TECHNIQUE: Multidetector CT imaging of the abdomen and pelvis was performed following the standard protocol without IV contrast.  COMPARISON:  None.  FINDINGS: BODY WALL: No contributory findings.  LOWER CHEST: No contributory findings.  ABDOMEN/PELVIS:  Liver: No focal abnormality.  Biliary: No evidence of biliary obstruction or stone.  Pancreas: Unremarkable.  Spleen: Unremarkable.  Adrenals: Unremarkable.  Kidneys and ureters: Numerous tiny stones throughout the bilateral  kidneys, nonobstructive. No ureteral calculus or hydronephrosis.  Bladder: There is a small bubble of gas in the bladder. Mild fat haziness around the bladder.  Reproductive: No pathologic findings.  Bowel: Inflammation surrounds a prominent descending sigmoid junction diverticulum. Colonic diverticulosis is moderate for age -mainly distal. No bowel obstruction. No close proximity of the diverticulitis to the bladder to relate bladder gas to the inflammation. No acute appendicitis.  Retroperitoneum: No mass or adenopathy.  Peritoneum: No ascites or pneumoperitoneum.  Vascular: No acute abnormality.  OSSEOUS: No acute abnormalities.  IMPRESSION: 1. Uncomplicated diverticulitis at the descending sigmoid junction. 2. Possible cystitis.  Correlate with urinalysis. 3. Numerous small bilateral renal calculi.   Electronically Signed   By: Monte Fantasia M.D.   On: 11/02/2014 23:41      MDM   Iv ns. Labs.  Reviewed nursing notes and prior charts for additional history.   Given r/o hematuria, and now flank pain, urgency, will get ct r/o ureteral stone.   Abd/pelvis exam is soft and non tender. Afeb.  Ct w mild diverticulitis. ua w small le and 3-6 wbc - cx sent.   Will rx cipro and flagl - dose given in ED.  Pt drove self to ed, motrin po.   Recheck pt comfortable. No nv. Afeb. Informed of ct results.  Pt currently appears stable for d/c.  Return precautions  provided.      Lajean Saver, MD 11/02/14 [phone]

## 2014-11-02 NOTE — ED Notes (Signed)
Patient presents stating about 2 months ago she noticed blood when she voided.  Followed up with her PMD and was told to follow up with a urologist

## 2014-11-03 NOTE — ED Notes (Signed)
Pt has had  Discharge oirders since 0000  Pt upset  About the wait to be discharged i was tied up with a post cpr that just got upsrairs.  noi d/c vitals pt just wanted to leave she is working this am

## 2014-11-04 LAB — URINE CULTURE: Colony Count: 8000

## 2015-01-25 ENCOUNTER — Ambulatory Visit: Payer: 59 | Admitting: Urology

## 2015-03-29 ENCOUNTER — Ambulatory Visit (INDEPENDENT_AMBULATORY_CARE_PROVIDER_SITE_OTHER): Payer: 59 | Admitting: Urology

## 2015-03-29 DIAGNOSIS — N200 Calculus of kidney: Secondary | ICD-10-CM | POA: Diagnosis not present

## 2016-06-08 HISTORY — PX: COLONOSCOPY: SHX174

## 2016-06-11 ENCOUNTER — Ambulatory Visit (INDEPENDENT_AMBULATORY_CARE_PROVIDER_SITE_OTHER): Payer: Commercial Managed Care - HMO | Admitting: Family Medicine

## 2016-06-11 VITALS — BP 110/72 | HR 75 | Temp 98.3°F | Resp 18 | Ht 64.0 in | Wt 134.0 lb

## 2016-06-11 DIAGNOSIS — J069 Acute upper respiratory infection, unspecified: Secondary | ICD-10-CM

## 2016-06-11 DIAGNOSIS — H6593 Unspecified nonsuppurative otitis media, bilateral: Secondary | ICD-10-CM | POA: Diagnosis not present

## 2016-06-11 MED ORDER — PREDNISONE 20 MG PO TABS: 20.0000 mg | ORAL_TABLET | Freq: Every day | ORAL | 0 refills | Status: DC

## 2016-06-11 MED ORDER — IPRATROPIUM BROMIDE 0.03 % NA SOLN: 2.0000 | Freq: Two times a day (BID) | NASAL | 0 refills | Status: DC

## 2016-06-11 NOTE — Progress Notes (Signed)
Patient ID: Helen Cook, female    DOB: 20-Oct-1960, 55 y.o.   MRN: [id number]  PCP: No PCP Per Patient  Chief Complaint  Patient presents with  . Sore Throat  . chest congestion    Subjective:  HPI  56 year old female presents for evaluation of chest congestion and sore throat times 2 days. She has no significant past medical hx. Reports nasal congestion, feverish last night  Minimal non-productive cough.Throat feels very dry. Ear fullness, bilaterally. Generalized body aches. She has taken Nyquil and Aleve with only minimal relief of symptoms.   Social History   Social History  . Marital status: Married    Spouse name: N/A  . Number of children: N/A  . Years of education: N/A   Occupational History  . Not on file.   Social History Main Topics  . Smoking status: Never Smoker  . Smokeless tobacco: Never Used  . Alcohol use No  . Drug use: No  . Sexual activity: Not on file   Other Topics Concern  . Not on file   Social History Narrative  . No narrative on file   Review of Systems See HPI  Patient Active Problem List   Diagnosis Date Noted  . IRRITABLE BOWEL SYNDROME 09/26/2009  . LIVER FUNCTION TESTS, ABNORMAL, HX OF 09/26/2009    Allergies  Allergen Reactions  . Codeine     Hives/ itching red bumps  . Penicillins     Swelling around the ears and eyes     Prior to Admission medications   Medication Sig Start Date End Date Taking? Authorizing Provider  Calcium Carbonate-Vitamin D (CALCIUM + D PO) Take 1 tablet by mouth daily.     Historical Provider, MD  HYDROcodone-acetaminophen (NORCO/VICODIN) 5-325 MG per tablet Take 1-2 tablets by mouth every 6 (six) hours as needed for moderate pain. Patient not taking: Reported on 06/11/2016 11/02/14   Lajean Saver, MD    Past Medical, Surgical Family and Social History reviewed and updated.    Objective:   Today's Vitals   06/11/16 1059  BP: 110/72  Pulse: 75  Resp: 18  Temp: 98.3 F (36.8 C)    TempSrc: Oral  SpO2: 97%  Weight: 134 lb (60.8 kg)  Height: 5\' 4"  (1.626 m)    Wt Readings from Last 3 Encounters:  06/11/16 134 lb (60.8 kg)  11/02/14 130 lb (59 kg)  03/31/11 134 lb (60.8 kg)   Physical Exam  Constitutional: She is oriented to person, place, and time. She appears well-developed and well-nourished.  HENT:  Head: Normocephalic and atraumatic.  Right Ear: Hearing normal. A middle ear effusion is present.  Left Ear: Hearing normal. A middle ear effusion is present.  Nose: Mucosal edema and rhinorrhea present.  Mouth/Throat: Uvula is midline and oropharynx is clear and moist.  Eyes: Conjunctivae are normal. Pupils are equal, round, and reactive to light.  Neck: Normal range of motion. Neck supple.  Cardiovascular: Normal rate, regular rhythm, normal heart sounds and intact distal pulses.   Pulmonary/Chest: Effort normal and breath sounds normal.  Musculoskeletal: Normal range of motion.  Neurological: She is alert and oriented to person, place, and time.  Skin: Skin is warm and dry.  Psychiatric: She has a normal mood and affect. Her behavior is normal. Judgment and thought content normal.       Assessment & Plan:  1. Acute upper respiratory infection 2. Middle ear effusion, bilateral Treat symptomatically for now- Plan: -Ipratropium (Atrovent) 0.03 %  nasal spray  -Prednisone (Deltasone) 20 mg daily with breakfast   Carroll Sage. Kenton Kingfisher, MSN, FNP-C [facility]

## 2016-06-11 NOTE — Patient Instructions (Addendum)
Start Prednisone 40 mg x five days with breakfast or lunch.  Start Atrovent nasal spray, 2 sprays per nares twice daily until ear fullness and nasal congestion resolves.  IF you received an x-ray today, you will receive an invoice from [facility]. Please contact [facility] at [phone] with questions or concerns regarding your invoice.   IF you received labwork today, you will receive an invoice from [facility]. Please contact [facility] at [phone] with questions or concerns regarding your invoice.   Our billing staff will not be able to assist you with questions regarding bills from these companies.  You will be contacted with the lab results as soon as they are available. The fastest way to get your results is to activate your My Chart account. Instructions are located on the last page of this paperwork. If you have not heard from [place] regarding the results in 2 weeks, please contact this office.      Upper Respiratory Infection, Adult Most upper respiratory infections (URIs) are caused by a virus. A URI affects the nose, throat, and upper air passages. The most common type of URI is often called "the common cold." Follow these instructions at home:  Take medicines only as told by your doctor.  Gargle warm saltwater or take cough drops to comfort your throat as told by your doctor.  Use a warm mist humidifier or inhale steam from a shower to increase air moisture. This may make it easier to breathe.  Drink enough fluid to keep your pee (urine) clear or pale yellow.  Eat soups and other clear broths.  Have a healthy diet.  Rest as needed.  Go back to work when your fever is gone or your doctor says it is okay.  You may need to stay home longer to avoid giving your URI to others.  You can also wear a face mask and wash your hands often to prevent spread of the virus.  Use your inhaler more if you have asthma.  Do not use any tobacco products, including  cigarettes, chewing tobacco, or electronic cigarettes. If you need help quitting, ask your doctor. Contact a doctor if:  You are getting worse, not better.  Your symptoms are not helped by medicine.  You have chills.  You are getting more short of breath.  You have brown or red mucus.  You have yellow or brown discharge from your nose.  You have pain in your face, especially when you bend forward.  You have a fever.  You have puffy (swollen) neck glands.  You have pain while swallowing.  You have white areas in the back of your throat. Get help right away if:  You have very bad or constant:  Headache.  Ear pain.  Pain in your forehead, behind your eyes, and over your cheekbones (sinus pain).  Chest pain.  You have long-lasting (chronic) lung disease and any of the following:  Wheezing.  Long-lasting cough.  Coughing up blood.  A change in your usual mucus.  You have a stiff neck.  You have changes in your:  Vision.  Hearing.  Thinking.  Mood. This information is not intended to replace advice given to you by your health care provider. Make sure you discuss any questions you have with your health care provider. Document Released: 11/11/2007 Document Revised: 01/26/2016 Document Reviewed: 08/30/2013 Elsevier Interactive Patient Education  2017 [employer].

## 2016-08-08 DIAGNOSIS — M5136 Other intervertebral disc degeneration, lumbar region: Secondary | ICD-10-CM | POA: Diagnosis not present

## 2016-08-08 DIAGNOSIS — M546 Pain in thoracic spine: Secondary | ICD-10-CM | POA: Diagnosis not present

## 2016-08-08 DIAGNOSIS — M5134 Other intervertebral disc degeneration, thoracic region: Secondary | ICD-10-CM | POA: Diagnosis not present

## 2016-09-16 DIAGNOSIS — M9905 Segmental and somatic dysfunction of pelvic region: Secondary | ICD-10-CM | POA: Diagnosis not present

## 2016-09-16 DIAGNOSIS — M9902 Segmental and somatic dysfunction of thoracic region: Secondary | ICD-10-CM | POA: Diagnosis not present

## 2016-09-16 DIAGNOSIS — M5408 Panniculitis affecting regions of neck and back, sacral and sacrococcygeal region: Secondary | ICD-10-CM | POA: Diagnosis not present

## 2016-09-22 DIAGNOSIS — M5408 Panniculitis affecting regions of neck and back, sacral and sacrococcygeal region: Secondary | ICD-10-CM | POA: Diagnosis not present

## 2016-09-22 DIAGNOSIS — M9902 Segmental and somatic dysfunction of thoracic region: Secondary | ICD-10-CM | POA: Diagnosis not present

## 2016-09-22 DIAGNOSIS — M9905 Segmental and somatic dysfunction of pelvic region: Secondary | ICD-10-CM | POA: Diagnosis not present

## 2016-09-24 DIAGNOSIS — Z01419 Encounter for gynecological examination (general) (routine) without abnormal findings: Secondary | ICD-10-CM | POA: Diagnosis not present

## 2016-09-24 DIAGNOSIS — Z6824 Body mass index (BMI) 24.0-24.9, adult: Secondary | ICD-10-CM | POA: Diagnosis not present

## 2016-09-24 DIAGNOSIS — G4700 Insomnia, unspecified: Secondary | ICD-10-CM | POA: Insufficient documentation

## 2016-09-24 DIAGNOSIS — Z1231 Encounter for screening mammogram for malignant neoplasm of breast: Secondary | ICD-10-CM | POA: Diagnosis not present

## 2016-09-25 DIAGNOSIS — M5408 Panniculitis affecting regions of neck and back, sacral and sacrococcygeal region: Secondary | ICD-10-CM | POA: Diagnosis not present

## 2016-09-25 DIAGNOSIS — M9902 Segmental and somatic dysfunction of thoracic region: Secondary | ICD-10-CM | POA: Diagnosis not present

## 2016-09-25 DIAGNOSIS — M9905 Segmental and somatic dysfunction of pelvic region: Secondary | ICD-10-CM | POA: Diagnosis not present

## 2016-09-29 DIAGNOSIS — Z0000 Encounter for general adult medical examination without abnormal findings: Secondary | ICD-10-CM | POA: Diagnosis not present

## 2016-09-29 DIAGNOSIS — Z800 Family history of malignant neoplasm of digestive organs: Secondary | ICD-10-CM | POA: Diagnosis not present

## 2016-09-29 DIAGNOSIS — Z8042 Family history of malignant neoplasm of prostate: Secondary | ICD-10-CM | POA: Diagnosis not present

## 2016-10-06 DIAGNOSIS — M8589 Other specified disorders of bone density and structure, multiple sites: Secondary | ICD-10-CM | POA: Diagnosis not present

## 2016-10-06 DIAGNOSIS — H6121 Impacted cerumen, right ear: Secondary | ICD-10-CM | POA: Diagnosis not present

## 2016-10-06 DIAGNOSIS — H608X3 Other otitis externa, bilateral: Secondary | ICD-10-CM | POA: Diagnosis not present

## 2016-11-12 DIAGNOSIS — A084 Viral intestinal infection, unspecified: Secondary | ICD-10-CM | POA: Diagnosis not present

## 2016-12-01 DIAGNOSIS — H5213 Myopia, bilateral: Secondary | ICD-10-CM | POA: Diagnosis not present

## 2016-12-01 DIAGNOSIS — H524 Presbyopia: Secondary | ICD-10-CM | POA: Diagnosis not present

## 2016-12-01 DIAGNOSIS — H52223 Regular astigmatism, bilateral: Secondary | ICD-10-CM | POA: Diagnosis not present

## 2016-12-17 DIAGNOSIS — D1801 Hemangioma of skin and subcutaneous tissue: Secondary | ICD-10-CM | POA: Diagnosis not present

## 2016-12-17 DIAGNOSIS — L814 Other melanin hyperpigmentation: Secondary | ICD-10-CM | POA: Diagnosis not present

## 2016-12-17 DIAGNOSIS — L821 Other seborrheic keratosis: Secondary | ICD-10-CM | POA: Diagnosis not present

## 2016-12-30 DIAGNOSIS — R197 Diarrhea, unspecified: Secondary | ICD-10-CM | POA: Diagnosis not present

## 2017-01-01 DIAGNOSIS — R197 Diarrhea, unspecified: Secondary | ICD-10-CM | POA: Diagnosis not present

## 2017-01-05 DIAGNOSIS — Z1211 Encounter for screening for malignant neoplasm of colon: Secondary | ICD-10-CM | POA: Diagnosis not present

## 2017-01-05 DIAGNOSIS — Z8371 Family history of colonic polyps: Secondary | ICD-10-CM | POA: Diagnosis not present

## 2017-01-05 DIAGNOSIS — K5730 Diverticulosis of large intestine without perforation or abscess without bleeding: Secondary | ICD-10-CM | POA: Diagnosis not present

## 2017-03-07 DIAGNOSIS — H6692 Otitis media, unspecified, left ear: Secondary | ICD-10-CM | POA: Diagnosis not present

## 2017-03-07 DIAGNOSIS — J019 Acute sinusitis, unspecified: Secondary | ICD-10-CM | POA: Diagnosis not present

## 2017-03-23 DIAGNOSIS — R05 Cough: Secondary | ICD-10-CM | POA: Diagnosis not present

## 2017-03-23 DIAGNOSIS — J019 Acute sinusitis, unspecified: Secondary | ICD-10-CM | POA: Diagnosis not present

## 2017-04-14 DIAGNOSIS — M5416 Radiculopathy, lumbar region: Secondary | ICD-10-CM | POA: Diagnosis not present

## 2017-04-14 DIAGNOSIS — M545 Low back pain: Secondary | ICD-10-CM | POA: Diagnosis not present

## 2017-04-14 DIAGNOSIS — R030 Elevated blood-pressure reading, without diagnosis of hypertension: Secondary | ICD-10-CM | POA: Diagnosis not present

## 2017-04-20 DIAGNOSIS — Z23 Encounter for immunization: Secondary | ICD-10-CM | POA: Diagnosis not present

## 2017-05-14 DIAGNOSIS — H0014 Chalazion left upper eyelid: Secondary | ICD-10-CM | POA: Diagnosis not present

## 2017-07-13 DIAGNOSIS — I10 Essential (primary) hypertension: Secondary | ICD-10-CM | POA: Diagnosis not present

## 2017-07-13 DIAGNOSIS — R109 Unspecified abdominal pain: Secondary | ICD-10-CM | POA: Diagnosis not present

## 2017-07-27 DIAGNOSIS — M545 Low back pain: Secondary | ICD-10-CM | POA: Diagnosis not present

## 2017-07-27 DIAGNOSIS — H01004 Unspecified blepharitis left upper eyelid: Secondary | ICD-10-CM | POA: Diagnosis not present

## 2017-07-27 DIAGNOSIS — M5416 Radiculopathy, lumbar region: Secondary | ICD-10-CM | POA: Diagnosis not present

## 2017-08-10 DIAGNOSIS — H1045 Other chronic allergic conjunctivitis: Secondary | ICD-10-CM | POA: Diagnosis not present

## 2017-08-25 DIAGNOSIS — M5416 Radiculopathy, lumbar region: Secondary | ICD-10-CM | POA: Diagnosis not present

## 2017-08-25 DIAGNOSIS — M545 Low back pain: Secondary | ICD-10-CM | POA: Diagnosis not present

## 2017-08-25 DIAGNOSIS — M47816 Spondylosis without myelopathy or radiculopathy, lumbar region: Secondary | ICD-10-CM | POA: Diagnosis not present

## 2017-08-31 DIAGNOSIS — H01133 Eczematous dermatitis of right eye, unspecified eyelid: Secondary | ICD-10-CM | POA: Diagnosis not present

## 2017-08-31 DIAGNOSIS — H01136 Eczematous dermatitis of left eye, unspecified eyelid: Secondary | ICD-10-CM | POA: Diagnosis not present

## 2017-09-16 ENCOUNTER — Encounter: Payer: Self-pay | Admitting: Allergy and Immunology

## 2017-09-16 ENCOUNTER — Ambulatory Visit: Payer: 59 | Admitting: Allergy and Immunology

## 2017-09-16 VITALS — BP 122/66 | HR 80 | Temp 98.1°F | Resp 16 | Ht 64.0 in | Wt 140.2 lb

## 2017-09-16 DIAGNOSIS — J302 Other seasonal allergic rhinitis: Secondary | ICD-10-CM | POA: Insufficient documentation

## 2017-09-16 DIAGNOSIS — J3089 Other allergic rhinitis: Secondary | ICD-10-CM | POA: Diagnosis not present

## 2017-09-16 DIAGNOSIS — H1013 Acute atopic conjunctivitis, bilateral: Secondary | ICD-10-CM | POA: Diagnosis not present

## 2017-09-16 DIAGNOSIS — H1010 Acute atopic conjunctivitis, unspecified eye: Secondary | ICD-10-CM | POA: Insufficient documentation

## 2017-09-16 MED ORDER — MOMETASONE FUROATE 50 MCG/ACT NA SUSP: NASAL | 5 refills | Status: DC

## 2017-09-16 MED ORDER — LEVOCETIRIZINE DIHYDROCHLORIDE 5 MG PO TABS: 5.0000 mg | ORAL_TABLET | Freq: Every day | ORAL | 5 refills | Status: DC | PRN

## 2017-09-16 MED ORDER — OLOPATADINE HCL 0.7 % OP SOLN: 1.0000 [drp] | Freq: Every day | OPHTHALMIC | 5 refills | Status: DC | PRN

## 2017-09-16 NOTE — Assessment & Plan Note (Signed)
   Aeroallergen avoidance measures have been discussed and provided in written form.  A prescription has been provided for levocetirizine, 5 mg daily as needed.  A prescription has been provided for Nasonex nasal spray, one spray per nostril 1-2 times daily as needed. Proper nasal spray technique has been discussed and demonstrated.  Nasal saline spray (i.e., Simply Saline) or nasal saline lavage (i.e., NeilMed) is recommended as needed and prior to medicated nasal sprays.  If allergen avoidance measures and medications fail to adequately relieve symptoms, aeroallergen immunotherapy will be considered.

## 2017-09-16 NOTE — Progress Notes (Signed)
New Patient Note  RE: Helen Cook MRN: [id number] DOB: 1961-05-08 Date of Office Visit: 09/16/2017  Referring provider: No ref. provider found Primary care provider: Paula Compton, MD  Chief Complaint: Conjunctivitis and Angioedema   History of present illness: Helen Cook is a 57 y.o. female presenting today for evaluation of eyelid swelling. She reports that approximately 6 weeks ago she had been outside doing yard work for the day.  The next morning she woke up with red, itchy, watery eyes and swollen eyelids.  She purchased some over-the-counter Visine without significant benefit.  She went to go see her primary care physician who sent her to her optometris.  The optometrist provided a prescription thyroid eyedrop which provided significant relief.  She was also prescribed a nasal spray as she had been experiencing nasal congestion, postnasal drainage, sneezing, and mild sinus pressure.  Both of her daughters have severe seasonal allergic rhinoconjunctivitis, however Helen Cook has never had a significant problem with allergies in the past.  Assessment and plan: Seasonal and perennial allergic rhinitis  Aeroallergen avoidance measures have been discussed and provided in written form.  A prescription has been provided for levocetirizine, 5 mg daily as needed.  A prescription has been provided for Nasonex nasal spray, one spray per nostril 1-2 times daily as needed. Proper nasal spray technique has been discussed and demonstrated.  Nasal saline spray (i.e., Simply Saline) or nasal saline lavage (i.e., NeilMed) is recommended as needed and prior to medicated nasal sprays.  If allergen avoidance measures and medications fail to adequately relieve symptoms, aeroallergen immunotherapy will be considered.  Allergic conjunctivitis  Treatment plan as outlined above for allergic rhinitis.  A prescription has been provided for Pazeo, one drop per eye daily as needed.  I have also  recommended eye lubricant drops (i.e., Natural Tears) as needed.   Meds ordered this encounter  Medications  . levocetirizine (XYZAL) 5 MG tablet    Sig: Take 1 tablet (5 mg total) by mouth daily as needed for allergies.    Dispense:  30 tablet    Refill:  5  . mometasone (NASONEX) 50 MCG/ACT nasal spray    Sig: Use 1 spray per nostril 1-2 times daily as needed    Dispense:  17 g    Refill:  5  . Olopatadine HCl (PAZEO) 0.7 % SOLN    Sig: Place 1 drop into both eyes daily as needed.    Dispense:  1 Bottle    Refill:  5    Diagnostics: Environmental skin testing: Positive to grass pollen, weed pollen, ragweed pollen, tree pollen, molds, cockroach antigen, and dust mite antigen. Food allergen skin testing: Negative despite a positive histamine control.    Physical examination: Blood pressure 122/66, pulse 80, temperature 98.1 F (36.7 C), temperature source Oral, resp. rate 16, height 5\' 4"  (1.626 m), weight 140 lb 3.2 oz (63.6 kg), SpO2 96 %.  General: Alert, interactive, in no acute distress. HEENT: TMs pearly gray, turbinates moderately edematous without discharge, post-pharynx unremarkable. Neck: Supple without lymphadenopathy. Lungs: Clear to auscultation without wheezing, rhonchi or rales. CV: Normal S1, S2 without murmurs. Abdomen: Nondistended, nontender. Skin: Warm and dry, without lesions or rashes. Extremities:  No clubbing, cyanosis or edema. Neuro:   Grossly intact.  Review of systems:  Review of systems negative except as noted in HPI / PMHx or noted below: Review of Systems  Constitutional: Negative.   HENT: Negative.   Eyes: Negative.   Respiratory: Negative.   Cardiovascular:  Negative.   Gastrointestinal: Negative.   Genitourinary: Negative.   Musculoskeletal: Negative.   Skin: Negative.   Neurological: Negative.   Endo/Heme/Allergies: Negative.   Psychiatric/Behavioral: Negative.     Past medical history:  Past Medical History:  Diagnosis Date    . Abdominal pain, RUQ    RUQ and also radiates to the center of abdomen   . IBS (irritable bowel syndrome)     Past surgical history:  Past Surgical History:  Procedure Laterality Date  . NOVASURE ABLATION  05/22/10  . TONSILLECTOMY    . WISDOM TOOTH EXTRACTION      Family history: Family History  Problem Relation Age of Onset  . Allergic rhinitis Father   . Migraines Daughter   . Asthma Neg Hx   . Eczema Neg Hx   . Urticaria Neg Hx   . Immunodeficiency Neg Hx   . Angioedema Neg Hx     Social history: Social History   Socioeconomic History  . Marital status: Married    Spouse name: Not on file  . Number of children: Not on file  . Years of education: Not on file  . Highest education level: Not on file  Occupational History  . Not on file  Social Needs  . Financial resource strain: Not on file  . Food insecurity:    Worry: Not on file    Inability: Not on file  . Transportation needs:    Medical: Not on file    Non-medical: Not on file  Tobacco Use  . Smoking status: Never Smoker  . Smokeless tobacco: Never Used  Substance and Sexual Activity  . Alcohol use: No  . Drug use: No  . Sexual activity: Not on file  Lifestyle  . Physical activity:    Days per week: Not on file    Minutes per session: Not on file  . Stress: Not on file  Relationships  . Social connections:    Talks on phone: Not on file    Gets together: Not on file    Attends religious service: Not on file    Active member of club or organization: Not on file    Attends meetings of clubs or organizations: Not on file    Relationship status: Not on file  . Intimate partner violence:    Fear of current or ex partner: Not on file    Emotionally abused: Not on file    Physically abused: Not on file    Forced sexual activity: Not on file  Other Topics Concern  . Not on file  Social History Narrative  . Not on file   Environmental History: The patient lives in house [place] in [place] with  hardwood floors throughout and central air/heat.  There is a dog in the house which does not have access to her bedroom.  She was a non-smoker.  There is no known mold/water damage in the home.  Allergies as of 09/16/2017      Reactions   Codeine    Hives/ itching red bumps   Penicillins    Swelling around the ears and eyes       Medication List        Accurate as of 09/16/17  4:52 PM. Always use your most recent med list.          CALCIUM + D PO Take 1 tablet by mouth daily.   ipratropium 0.03 % nasal spray Commonly known as:  ATROVENT Place 2 sprays into both nostrils  2 (two) times daily.   levocetirizine 5 MG tablet Commonly known as:  XYZAL Take 1 tablet (5 mg total) by mouth daily as needed for allergies.   mometasone 50 MCG/ACT nasal spray Commonly known as:  NASONEX Use 1 spray per nostril 1-2 times daily as needed   Olopatadine HCl 0.7 % Soln Commonly known as:  PAZEO Place 1 drop into both eyes daily as needed.   prednisoLONE acetate 1 % ophthalmic suspension Commonly known as:  PRED FORTE prednisolone acetate 1 % eye drops,suspension  place 4 drops into each ear twice a day if needed for itching   predniSONE 20 MG tablet Commonly known as:  DELTASONE Take 1 tablet (20 mg total) by mouth daily with breakfast.       Known medication allergies: Allergies  Allergen Reactions  . Codeine     Hives/ itching red bumps  . Penicillins     Swelling around the ears and eyes     I appreciate the opportunity to take part in Helen Cook's care. Please do not hesitate to contact me with questions.  Sincerely,   R. Edgar Frisk, MD

## 2017-09-16 NOTE — Assessment & Plan Note (Signed)
   Treatment plan as outlined above for allergic rhinitis.  A prescription has been provided for Pazeo, one drop per eye daily as needed.  I have also recommended eye lubricant drops (i.e., Natural Tears) as needed. 

## 2017-09-16 NOTE — Patient Instructions (Addendum)
Seasonal and perennial allergic rhinitis  Aeroallergen avoidance measures have been discussed and provided in written form.  A prescription has been provided for levocetirizine, 5 mg daily as needed.  A prescription has been provided for Nasonex nasal spray, one spray per nostril 1-2 times daily as needed. Proper nasal spray technique has been discussed and demonstrated.  Nasal saline spray (i.e., Simply Saline) or nasal saline lavage (i.e., NeilMed) is recommended as needed and prior to medicated nasal sprays.  If allergen avoidance measures and medications fail to adequately relieve symptoms, aeroallergen immunotherapy will be considered.  Allergic conjunctivitis  Treatment plan as outlined above for allergic rhinitis.  A prescription has been provided for Pazeo, one drop per eye daily as needed.  I have also recommended eye lubricant drops (i.e., Natural Tears) as needed.   Return in about 4 months (around 01/16/2018), or if symptoms worsen or fail to improve.  Reducing Pollen Exposure  The American Academy of Allergy, Asthma and Immunology suggests the following steps to reduce your exposure to pollen during allergy seasons.    1. Do not hang sheets or clothing out to dry; pollen may collect on these items. 2. Do not mow lawns or spend time around freshly cut grass; mowing stirs up pollen. 3. Keep windows closed at night.  Keep car windows closed while driving. 4. Minimize morning activities outdoors, a time when pollen counts are usually at their highest. 5. Stay indoors as much as possible when pollen counts or humidity is high and on windy days when pollen tends to remain in the air longer. 6. Use air conditioning when possible.  Many air conditioners have filters that trap the pollen spores. 7. Use a HEPA room air filter to remove pollen form the indoor air you breathe.   Control of House Dust Mite Allergen  House dust mites play a major role in allergic asthma and  rhinitis.  They occur in environments with high humidity wherever human skin, the food for dust mites is found. High levels have been detected in dust obtained from mattresses, pillows, carpets, upholstered furniture, bed covers, clothes and soft toys.  The principal allergen of the house dust mite is found in its feces.  A gram of dust may contain 1,000 mites and 250,000 fecal particles.  Mite antigen is easily measured in the air during house cleaning activities.    1. Encase mattresses, including the box spring, and pillow, in an air tight cover.  Seal the zipper end of the encased mattresses with wide adhesive tape. 2. Wash the bedding in water of 130 degrees Farenheit weekly.  Avoid cotton comforters/quilts and flannel bedding: the most ideal bed covering is the dacron comforter. 3. Remove all upholstered furniture from the bedroom. 4. Remove carpets, carpet padding, rugs, and non-washable window drapes from the bedroom.  Wash drapes weekly or use plastic window coverings. 5. Remove all non-washable stuffed toys from the bedroom.  Wash stuffed toys weekly. 6. Have the room cleaned frequently with a vacuum cleaner and a damp dust-mop.  The patient should not be in a room which is being cleaned and should wait 1 hour after cleaning before going into the room. 7. Close and seal all heating outlets in the bedroom.  Otherwise, the room will become filled with dust-laden air.  An electric heater can be used to heat the room. Reduce indoor humidity to less than 50%.  Do not use a humidifier.  Control of Dog or Cat Allergen  Avoidance is the best  way to manage a dog or cat allergy. If you have a dog or cat and are allergic to dog or cats, consider removing the dog or cat from the home. If you have a dog or cat but don't want to find it a new home, or if your family wants a pet even though someone in the household is allergic, here are some strategies that may help keep symptoms at bay:  1. Keep the pet  out of your bedroom and restrict it to only a few rooms. Be advised that keeping the dog or cat in only one room will not limit the allergens to that room. 2. Don't pet, hug or kiss the dog or cat; if you do, wash your hands with soap and water. 3. High-efficiency particulate air (HEPA) cleaners run continuously in a bedroom or living room can reduce allergen levels over time. 4. Place electrostatic material sheet in the air inlet vent in the bedroom. 5. Regular use of a high-efficiency vacuum cleaner or a central vacuum can reduce allergen levels. 6. Giving your dog or cat a bath at least once a week can reduce airborne allergen.  Control of Mold Allergen  Mold and fungi can grow on a variety of surfaces provided certain temperature and moisture conditions exist.  Outdoor molds grow on plants, decaying vegetation and soil.  The major outdoor mold, Alternaria and Cladosporium, are found in very high numbers during hot and dry conditions.  Generally, a late Summer - Fall peak is seen for common outdoor fungal spores.  Rain will temporarily lower outdoor mold spore count, but counts rise rapidly when the rainy period ends.  The most important indoor molds are Aspergillus and Penicillium.  Dark, humid and poorly ventilated basements are ideal sites for mold growth.  The next most common sites of mold growth are the bathroom and the kitchen.  Outdoor [employer] 1. Use air conditioning and keep windows closed 2. Avoid exposure to decaying vegetation. 3. Avoid leaf raking. 4. Avoid grain handling. 5. Consider wearing a face mask if working in moldy areas.  Indoor Mold Control 1. Maintain humidity below 50%. 2. Clean washable surfaces with 5% bleach solution. 3. Remove sources e.g. Contaminated carpets.  Control of Cockroach Allergen  Cockroach allergen has been identified as an important cause of acute attacks of asthma, especially in urban settings.  There are fifty-five species of cockroach  that exist in the [place], however only three, the [place], [place] species produce allergen that can affect patients with Asthma.  Allergens can be obtained from fecal particles, egg casings and secretions from cockroaches.    1. Remove food sources. 2. Reduce access to water. 3. Seal access and entry points. 4. Spray runways with 0.5-1% Diazinon or Chlorpyrifos 5. Blow boric acid power under stoves and refrigerator. 6. Place bait stations (hydramethylnon) at feeding sites.

## 2017-09-21 ENCOUNTER — Other Ambulatory Visit: Payer: Self-pay | Admitting: Allergy

## 2017-09-21 MED ORDER — FLUTICASONE PROPIONATE 50 MCG/ACT NA SUSP: 1.0000 | Freq: Two times a day (BID) | NASAL | 2 refills | Status: DC

## 2017-09-22 ENCOUNTER — Other Ambulatory Visit: Payer: Self-pay | Admitting: Allergy

## 2017-09-22 MED ORDER — OLOPATADINE HCL 0.2 % OP SOLN: 1.0000 [drp] | OPHTHALMIC | 5 refills | Status: DC

## 2017-09-29 DIAGNOSIS — H01136 Eczematous dermatitis of left eye, unspecified eyelid: Secondary | ICD-10-CM | POA: Diagnosis not present

## 2017-09-29 DIAGNOSIS — H01133 Eczematous dermatitis of right eye, unspecified eyelid: Secondary | ICD-10-CM | POA: Diagnosis not present

## 2017-11-09 DIAGNOSIS — H6121 Impacted cerumen, right ear: Secondary | ICD-10-CM | POA: Diagnosis not present

## 2017-11-09 DIAGNOSIS — H608X3 Other otitis externa, bilateral: Secondary | ICD-10-CM | POA: Diagnosis not present

## 2017-11-26 DIAGNOSIS — Z0000 Encounter for general adult medical examination without abnormal findings: Secondary | ICD-10-CM | POA: Diagnosis not present

## 2017-11-26 DIAGNOSIS — Z1231 Encounter for screening mammogram for malignant neoplasm of breast: Secondary | ICD-10-CM | POA: Diagnosis not present

## 2017-11-26 DIAGNOSIS — Z6824 Body mass index (BMI) 24.0-24.9, adult: Secondary | ICD-10-CM | POA: Diagnosis not present

## 2017-11-26 DIAGNOSIS — Z01419 Encounter for gynecological examination (general) (routine) without abnormal findings: Secondary | ICD-10-CM | POA: Diagnosis not present

## 2017-11-26 DIAGNOSIS — Z1389 Encounter for screening for other disorder: Secondary | ICD-10-CM | POA: Diagnosis not present

## 2017-12-02 DIAGNOSIS — R102 Pelvic and perineal pain: Secondary | ICD-10-CM | POA: Diagnosis not present

## 2018-01-26 ENCOUNTER — Ambulatory Visit: Payer: 59 | Admitting: Allergy and Immunology

## 2018-01-26 DIAGNOSIS — J309 Allergic rhinitis, unspecified: Secondary | ICD-10-CM

## 2018-02-17 DIAGNOSIS — R1011 Right upper quadrant pain: Secondary | ICD-10-CM | POA: Insufficient documentation

## 2018-02-17 DIAGNOSIS — R7989 Other specified abnormal findings of blood chemistry: Secondary | ICD-10-CM | POA: Insufficient documentation

## 2018-02-18 DIAGNOSIS — R350 Frequency of micturition: Secondary | ICD-10-CM | POA: Insufficient documentation

## 2018-08-10 ENCOUNTER — Ambulatory Visit: Payer: 59 | Admitting: Internal Medicine

## 2018-08-10 ENCOUNTER — Other Ambulatory Visit (INDEPENDENT_AMBULATORY_CARE_PROVIDER_SITE_OTHER): Payer: 59

## 2018-08-10 ENCOUNTER — Encounter: Payer: Self-pay | Admitting: Internal Medicine

## 2018-08-10 VITALS — BP 120/76 | HR 68 | Ht 64.0 in | Wt 133.8 lb

## 2018-08-10 DIAGNOSIS — R634 Abnormal weight loss: Secondary | ICD-10-CM | POA: Diagnosis not present

## 2018-08-10 DIAGNOSIS — R10813 Right lower quadrant abdominal tenderness: Secondary | ICD-10-CM

## 2018-08-10 DIAGNOSIS — R10816 Epigastric abdominal tenderness: Secondary | ICD-10-CM

## 2018-08-10 DIAGNOSIS — R630 Anorexia: Secondary | ICD-10-CM

## 2018-08-10 DIAGNOSIS — R195 Other fecal abnormalities: Secondary | ICD-10-CM

## 2018-08-10 LAB — COMPREHENSIVE METABOLIC PANEL
ALT: 34 U/L (ref 0–35)
AST: 32 U/L (ref 0–37)
Albumin: 4.6 g/dL (ref 3.5–5.2)
Alkaline Phosphatase: 56 U/L (ref 39–117)
BUN: 9 mg/dL (ref 6–23)
CHLORIDE: 102 meq/L (ref 96–112)
CO2: 30 mEq/L (ref 19–32)
Calcium: 9.1 mg/dL (ref 8.4–10.5)
Creatinine, Ser: 0.6 mg/dL (ref 0.40–1.20)
GFR: 102.54 mL/min (ref >60.00)
GLUCOSE: 90 mg/dL (ref 70–99)
POTASSIUM: 3.7 meq/L (ref 3.5–5.1)
SODIUM: 140 meq/L (ref 135–145)
Total Bilirubin: 0.7 mg/dL (ref 0.2–1.2)
Total Protein: 7 g/dL (ref 6.0–8.3)

## 2018-08-10 LAB — CBC WITH DIFFERENTIAL/PLATELET
BASOS ABS: 0 10*3/uL (ref 0.0–0.1)
Basophils Relative: 0.6 % (ref 0.0–3.0)
Eosinophils Absolute: 0.1 10*3/uL (ref 0.0–0.7)
Eosinophils Relative: 2.1 % (ref 0.0–5.0)
HCT: 40.4 % (ref 36.0–46.0)
Hemoglobin: 14.1 g/dL (ref 12.0–15.0)
Lymphocytes Relative: 31.3 % (ref 12.0–46.0)
Lymphs Abs: 2 10*3/uL (ref 0.7–4.0)
MCHC: 34.9 g/dL (ref 30.0–36.0)
MCV: 88.4 fl (ref 78.0–100.0)
Monocytes Absolute: 0.4 10*3/uL (ref 0.1–1.0)
Monocytes Relative: 6.8 % (ref 3.0–12.0)
NEUTROS ABS: 3.8 10*3/uL (ref 1.4–7.7)
Neutrophils Relative %: 59.2 % (ref 43.0–77.0)
Platelets: 177 10*3/uL (ref 150.0–400.0)
RBC: 4.57 Mil/uL (ref 3.87–5.11)
RDW: 12.3 % (ref 11.5–15.5)
WBC: 6.4 10*3/uL (ref 4.0–10.5)

## 2018-08-10 LAB — AMYLASE: AMYLASE: 67 U/L (ref 27–131)

## 2018-08-10 LAB — LIPASE: Lipase: 30 U/L (ref 11.0–59.0)

## 2018-08-10 NOTE — Patient Instructions (Signed)
   Your provider has requested that you go to the basement level for lab work before leaving today. Press "B" on the elevator. The lab is located at the first door on the left as you exit the elevator.   We are going to get your records from Dr Paulita Fujita.   You have been scheduled for a CT scan of the abdomen and pelvis at [facility] ([address]---this is in the same building as Press [facility]).   You are scheduled on 08/12/2018 at 4:15pm. You should arrive 15 minutes prior to your appointment time for registration. Please follow the written instructions below on the day of your exam:  WARNING: IF YOU ARE ALLERGIC TO IODINE/X-RAY DYE, PLEASE NOTIFY RADIOLOGY IMMEDIATELY AT [phone]! YOU WILL BE GIVEN A 13 HOUR PREMEDICATION PREP.  1) Do not eat  anything after 1:00pm (4 hours prior to your test)  PLEASE DRINK FLUIDS, WE WANT YOU HYDRATED.  2) You have been given 2 bottles of oral contrast to drink. The solution may taste better if refrigerated, but do NOT add ice or any other liquid to this solution. Shake well before drinking.    Drink 1 bottle of contrast @ 2:00pm (2 hours prior to your exam)  Drink 1 bottle of contrast @ 3:00pm (1 hour prior to your exam)  You may take any medications as prescribed with a small amount of water, if necessary. If you take any of the following medications: METFORMIN, GLUCOPHAGE, GLUCOVANCE, AVANDAMET, RIOMET, FORTAMET, [place] MET, JANUMET, GLUMETZA or METAGLIP, you MAY be asked to HOLD this medication 48 hours AFTER the exam.  The purpose of you drinking the oral contrast is to aid in the visualization of your intestinal tract. The contrast solution may cause some diarrhea. Depending on your individual set of symptoms, you may also receive an intravenous injection of x-ray contrast/dye. Plan on being at [facility] for 30 minutes or longer, depending on the type of exam you are having performed.  This test typically takes 30-45  minutes to complete.  If you have any questions regarding your exam or if you need to reschedule, you may call the CT department at [phone] between the hours of 8:00 am and 5:00 pm, Monday-Friday.  ________________________________________________________________________  I appreciate the opportunity to care for you. Silvano Rusk, MD, [facility]

## 2018-08-10 NOTE — Progress Notes (Addendum)
Helen Cook 58 y.o. 06-07-1961 [id number]  Assessment & Plan:   Encounter Diagnoses  Name Primary?  . Right lower quadrant abdominal tenderness without rebound tenderness Yes  . Epigastric abdominal tenderness without rebound tenderness   . Loss of weight   . Abnormal stool color   . Anorexia     Orders Placed This Encounter  Procedures  . CT Abdomen Pelvis W Contrast  . CBC with Differential/Platelet  . Comprehensive metabolic panel  . Amylase  . Lipase   Labs were all ok  Lab Results  Component Value Date   WBC 6.4 08/10/2018   HGB 14.1 08/10/2018   HCT 40.4 08/10/2018   MCV 88.4 08/10/2018   PLT 177.0 08/10/2018     Chemistry      Component Value Date/Time   NA 140 08/10/2018 1132   K 3.7 08/10/2018 1132   CL 102 08/10/2018 1132   CO2 30 08/10/2018 1132   BUN 9 08/10/2018 1132   CREATININE 0.60 08/10/2018 1132      Component Value Date/Time   CALCIUM 9.1 08/10/2018 1132   ALKPHOS 56 08/10/2018 1132   AST 32 08/10/2018 1132   ALT 34 08/10/2018 1132   BILITOT 0.7 08/10/2018 1132     Lab Results  Component Value Date   LIPASE 30.0 08/10/2018   Lab Results  Component Value Date   AMYLASE 67 08/10/2018    Await CT  ? IBS, IBD, malignancy (doubt) Review past colonoscopy also   cc;Richardson, Juliann Pulse, MD  08/16/2018  Colonoscopy 2018 - left side diverticulosis  Indication FHx colon polyps - recall was 5 years  ??? If necessary q 5 - father and mother did have polyps and aunt had stomach and colon cancer - needs clarification  Subjective:   Chief Complaint: Change in bowel habits ? IBS/other  HPI Helen Cook is here self-referred because of abdominal pain and weight loss.  She also has change in bowel habits.  She carries a diagnosis of irritable bowel syndrome and has been seen by Dr. Delfin Edis in the past and then Dr. Arta Silence, with previous EGD and colonoscopy in the last few years.  She reports 2018 colonoscopy was negative  (records requested) and she had a normal EGD in 2012.  For several months or more she has been having problems with decreased appetite and pain in the right upper quadrant and epigastrium.  It is generally an ache or a soreness and tenderness but sometimes they are sharp stabbing pains.  It is not necessarily affected by eating or defecation.  Her stools have changed from a normal brown to a gold color.  There is no dark urine or jaundice.  She says she is lost 12 pounds without trying.  No new medications.  She has been diagnosed with seasonal allergies in the last year or so and those are new but they predate the symptoms significantly.  She has been bothered for about 6 months.  Some heartburn and indigestion at times as well.  Not on medications for that.  GI review of systems seems otherwise negative. Wt Readings from Last 3 Encounters:  08/10/18 133 lb 12.8 oz (60.7 kg)  09/16/17 140 lb 3.2 oz (63.6 kg)  06/11/16 134 lb (60.8 kg)    Allergies  Allergen Reactions  . Codeine     Hives/ itching red bumps  . Penicillins     Swelling around the ears and eyes    Current Meds  Medication Sig  . Calcium  Carbonate-Vitamin D (CALCIUM + D PO) Take 1 tablet by mouth daily.   . fluticasone (FLONASE) 50 MCG/ACT nasal spray Place 1 spray into both nostrils 2 (two) times daily.  Marland Kitchen ipratropium (ATROVENT) 0.03 % nasal spray Place 2 sprays into both nostrils 2 (two) times daily.  Marland Kitchen levocetirizine (XYZAL) 5 MG tablet Take 1 tablet (5 mg total) by mouth daily as needed for allergies.  . mometasone (NASONEX) 50 MCG/ACT nasal spray Use 1 spray per nostril 1-2 times daily as needed  . Olopatadine HCl (PATADAY) 0.2 % SOLN Place 1 drop into both eyes 1 day or 1 dose. Place one drop each eye one time a day  . Olopatadine HCl (PAZEO) 0.7 % SOLN Place 1 drop into both eyes daily as needed.  . prednisoLONE acetate (PRED FORTE) 1 % ophthalmic suspension prednisolone acetate 1 % eye drops,suspension  place 4 drops  into each ear twice a day if needed for itching   Past Medical History:  Diagnosis Date  . Abdominal pain, RUQ    RUQ and also radiates to the center of abdomen   . IBS (irritable bowel syndrome)    Past Surgical History:  Procedure Laterality Date  . COLONOSCOPY  2018  . ESOPHAGOGASTRODUODENOSCOPY  2012  . NOVASURE ABLATION  05/22/10  . TONSILLECTOMY    . WISDOM TOOTH EXTRACTION     Social History   Social History Narrative   Patient is married to [place].  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   2 daughters   Never smoker   No drug use   No other tobacco no alcohol and no caffeine   family history includes Allergic rhinitis in her father; Breast cancer in her maternal grandmother; Colon cancer in her maternal aunt; Colon polyps in her father and mother; Diabetes in her maternal grandfather, maternal grandmother, paternal grandfather, and paternal grandmother; Irritable bowel syndrome in her father; Liver disease in her father; Migraines in her daughter; Stomach cancer in her maternal aunt.   Review of Systems See HPI.  All other review of systems appear negative at this time.  Objective:   Physical Exam @BP  120/76   Pulse 68   Ht 5\' 4"  (1.626 m)   Wt 133 lb 12.8 oz (60.7 kg)   BMI 22.97 kg/m @  General:  Well-developed, well-nourished and in no acute distress Eyes:  anicteric. ENT:   Mouth and posterior pharynx free of lesions.  Neck:   supple w/o thyromegaly or mass.  Lungs: Clear to auscultation bilaterally. Heart:  S1S2, no rubs, murmurs, gallops. Abdomen:  soft, tender in right mid lower quadrant and epigastrium with question slight fullness there especially right lower quadrant, no hepatosplenomegaly, hernia, or mass and BS+.  Lymph:  no cervical or supraclavicular adenopathy. Extremities:   no edema, cyanosis or clubbing Skin   no rash. Neuro:  A&O x 3.  Psych:  appropriate mood and   Affect.   Data Reviewed:  See HPI

## 2018-08-11 ENCOUNTER — Encounter: Payer: Self-pay | Admitting: Internal Medicine

## 2018-08-11 NOTE — Progress Notes (Signed)
Labs ok CT scan tomorrow Will contact when that is resulted

## 2018-08-12 ENCOUNTER — Ambulatory Visit (INDEPENDENT_AMBULATORY_CARE_PROVIDER_SITE_OTHER)
Admission: RE | Admit: 2018-08-12 | Discharge: 2018-08-12 | Disposition: A | Discharge status: Home / Self Care | Payer: 59 | Source: Ambulatory Visit | Attending: Internal Medicine | Admitting: Internal Medicine

## 2018-08-12 DIAGNOSIS — R634 Abnormal weight loss: Secondary | ICD-10-CM | POA: Diagnosis not present

## 2018-08-12 DIAGNOSIS — R630 Anorexia: Secondary | ICD-10-CM

## 2018-08-12 DIAGNOSIS — R10816 Epigastric abdominal tenderness: Secondary | ICD-10-CM

## 2018-08-12 DIAGNOSIS — R195 Other fecal abnormalities: Secondary | ICD-10-CM

## 2018-08-12 DIAGNOSIS — R10813 Right lower quadrant abdominal tenderness: Secondary | ICD-10-CM

## 2018-08-12 MED ORDER — IOHEXOL 300 MG/ML  SOLN
100.0000 mL | Freq: Once | INTRAMUSCULAR | Status: AC | PRN
  Administered 2018-08-12: 100 mL via INTRAVENOUS

## 2018-08-14 NOTE — Progress Notes (Signed)
CT and labs were ok except for trace air in the bladder which is typically present after a urinary catheter was placed, but I do not think means anything bad even if that has not been done  Please rc dicyclomine 20 mg ac # 90 no refill  Also set up an EGD dx epigastric pain and weight loss

## 2018-08-15 ENCOUNTER — Other Ambulatory Visit: Payer: Self-pay

## 2018-08-15 MED ORDER — DICYCLOMINE HCL 20 MG PO TABS: 20.0000 mg | ORAL_TABLET | Freq: Three times a day (TID) | ORAL | 0 refills | Status: DC

## 2018-08-17 ENCOUNTER — Other Ambulatory Visit: Payer: Self-pay

## 2018-08-17 ENCOUNTER — Ambulatory Visit (AMBULATORY_SURGERY_CENTER): Payer: Self-pay | Admitting: *Deleted

## 2018-08-17 ENCOUNTER — Encounter: Payer: Self-pay | Admitting: Internal Medicine

## 2018-08-17 VITALS — Ht 64.0 in | Wt 133.8 lb

## 2018-08-17 DIAGNOSIS — R10816 Epigastric abdominal tenderness: Secondary | ICD-10-CM

## 2018-08-17 DIAGNOSIS — R634 Abnormal weight loss: Secondary | ICD-10-CM

## 2018-08-17 NOTE — Progress Notes (Signed)
No egg or soy allergy known to patient  No issues with past sedation with any surgeries  or procedures, no intubation problems  No diet pills per patient No home 02 use per patient  No blood thinners per patient  Pt denies issues with constipation  No A fib or A flutter  EMMI video sent to pt's e mail  

## 2018-08-23 ENCOUNTER — Encounter: Payer: Self-pay | Admitting: *Deleted

## 2018-08-23 NOTE — Telephone Encounter (Signed)
Erroneous encounter

## 2018-08-24 ENCOUNTER — Encounter: Payer: 59 | Admitting: Internal Medicine

## 2018-09-26 ENCOUNTER — Other Ambulatory Visit: Payer: Self-pay

## 2018-09-26 DIAGNOSIS — H1013 Acute atopic conjunctivitis, bilateral: Secondary | ICD-10-CM

## 2018-09-26 DIAGNOSIS — J3089 Other allergic rhinitis: Secondary | ICD-10-CM

## 2018-11-08 ENCOUNTER — Encounter: Payer: Self-pay | Admitting: Internal Medicine

## 2018-11-08 ENCOUNTER — Other Ambulatory Visit: Payer: Self-pay

## 2018-11-08 ENCOUNTER — Ambulatory Visit (AMBULATORY_SURGERY_CENTER): Payer: Self-pay

## 2018-11-08 VITALS — Ht 64.0 in | Wt 133.0 lb

## 2018-11-08 DIAGNOSIS — R10816 Epigastric abdominal tenderness: Secondary | ICD-10-CM

## 2018-11-08 NOTE — Progress Notes (Signed)
Denies allergies to eggs or soy products. Denies complication of anesthesia or sedation. Denies use of weight loss medication. Denies use of O2.   Emmi instructions declined.   Pre-Visit was conducted by phone due to  Covid 19. Instructions were reviewed and mailed to patients confirmed home address. Patient was encouraged to call if he had any questions or concerns regarding instructions.

## 2018-11-11 ENCOUNTER — Telehealth: Payer: Self-pay | Admitting: *Deleted

## 2018-11-11 NOTE — Telephone Encounter (Addendum)
Attempted to complete pre procedure screening. No answer. LMOM.  Covid-19 screening questions  Have you traveled in the last 14 days? No If yes where?  Do you now or have you had a fever in the last 14 days? No  Do you have any respiratory symptoms of shortness of breath or cough now or in the last 14 days? No  Do you have any family members or close contacts with diagnosed or suspected Covid-19 in the past 14 days? No  Have you been tested for Covid-19 and found to be positive? No, tested on May 15, negative

## 2018-11-14 ENCOUNTER — Encounter: Payer: Self-pay | Admitting: Internal Medicine

## 2018-11-14 ENCOUNTER — Ambulatory Visit (AMBULATORY_SURGERY_CENTER): Payer: 59 | Admitting: Internal Medicine

## 2018-11-14 ENCOUNTER — Other Ambulatory Visit: Payer: Self-pay

## 2018-11-14 VITALS — BP 130/76 | HR 89 | Temp 98.1°F | Resp 17 | Ht 64.0 in | Wt 133.0 lb

## 2018-11-14 DIAGNOSIS — R1013 Epigastric pain: Secondary | ICD-10-CM

## 2018-11-14 DIAGNOSIS — R1031 Right lower quadrant pain: Secondary | ICD-10-CM | POA: Diagnosis present

## 2018-11-14 DIAGNOSIS — R10816 Epigastric abdominal tenderness: Secondary | ICD-10-CM

## 2018-11-14 MED ORDER — SODIUM CHLORIDE 0.9 % IV SOLN: 500.0000 mL | Freq: Once | INTRAVENOUS | Status: DC

## 2018-11-14 NOTE — Op Note (Signed)
[place] Patient Name: Helen Cook Procedure Date: 11/14/2018 8:41 AM MRN: [id number] Endoscopist: Gatha Mayer , MD Age: 58 Referring MD:  Date of Birth: Dec 04, 1960 Gender: Female Account #: [id number] Procedure:                Upper GI endoscopy Indications:              Epigastric abdominal pain, Abdominal pain in the                            right upper quadrant Medicines:                Propofol per Anesthesia, Monitored Anesthesia Care Procedure:                Pre-Anesthesia Assessment:                           - Prior to the procedure, a History and Physical                            was performed, and patient medications and                            allergies were reviewed. The patient's tolerance of                            previous anesthesia was also reviewed. The risks                            and benefits of the procedure and the sedation                            options and risks were discussed with the patient.                            All questions were answered, and informed consent                            was obtained. Prior Anticoagulants: The patient has                            taken no previous anticoagulant or antiplatelet                            agents. ASA Grade Assessment: II - A patient with                            mild systemic disease. After reviewing the risks                            and benefits, the patient was deemed in                            satisfactory condition to undergo the procedure.  After obtaining informed consent, the endoscope was                            passed under direct vision. Throughout the                            procedure, the patient's blood pressure, pulse, and                            oxygen saturations were monitored continuously. The                            Endoscope was introduced through the mouth, and                            advanced to  the second part of duodenum. The upper                            GI endoscopy was accomplished without difficulty.                            The patient tolerated the procedure well. Scope In: Scope Out: Findings:                 The esophagus was normal.                           The stomach was normal.                           The examined duodenum was normal.                           The cardia and gastric fundus were normal on                            retroflexion. Complications:            No immediate complications. Estimated Blood Loss:     Estimated blood loss: none. Impression:               - Normal esophagus.                           - Normal stomach.                           - Normal examined duodenum.                           - No specimens collected. Recommendation:           - Patient has a contact number available for                            emergencies. The signs and symptoms of potential  delayed complications were discussed with the                            patient. Return to normal activities tomorrow.                            Written discharge instructions were provided to the                            patient.                           - Resume previous diet.                           - Continue present medications.                           - TRY IB GARD BEFORE MEALS INSTEAD OF DICYCLOMINE                           CALL AND MAKE APPOINTMENT FOR F/U VISIT 3-4 WEEKS -                            I THINK THIS IS RECURRENT FUNCTIONAL ABDOMINAL                            PAIN/IBS                           ? [place] Gatha Mayer, MD 11/14/2018 8:58:45 AM This report has been signed electronically.

## 2018-11-14 NOTE — Progress Notes (Signed)
D

## 2018-11-14 NOTE — Progress Notes (Signed)
No problems noted in the recovery room. maw 

## 2018-11-14 NOTE — Patient Instructions (Addendum)
The esophagus, stomach and duodenum all look ok.  That is good news.  Unfortunately you still have the pain and tenderness.   I am going to have you try a medication called IB Donald Prose to see if that helps - it is over the counter. This can replace dicyclomine.  Samples are provide.  Please take 2 before meals.  Also call soon and make an appointment to follow-up with me for 3-4 weeks from now.  I appreciate the opportunity to care for you. Gatha Mayer, MD, FACG    YOU HAD AN ENDOSCOPIC PROCEDURE TODAY AT [facility]:   Refer to the procedure report that was given to you for any specific questions about what was found during the examination.  If the procedure report does not answer your questions, please call your gastroenterologist to clarify.  If you requested that your care partner not be given the details of your procedure findings, then the procedure report has been included in a sealed envelope for you to review at your convenience later.  YOU SHOULD EXPECT: Some feelings of bloating in the abdomen. Passage of more gas than usual.  Walking can help get rid of the air that was put into your GI tract during the procedure and reduce the bloating. If you had a lower endoscopy (such as a colonoscopy or flexible sigmoidoscopy) you may notice spotting of blood in your stool or on the toilet paper. If you underwent a bowel prep for your procedure, you may not have a normal bowel movement for a few days.  Please Note:  You might notice some irritation and congestion in your nose or some drainage.  This is from the oxygen used during your procedure.  There is no need for concern and it should clear up in a day or so.  SYMPTOMS TO REPORT IMMEDIATELY:   Following upper endoscopy (EGD)  Vomiting of blood or coffee ground material  New chest pain or pain under the shoulder blades  Painful or persistently difficult swallowing  New shortness of breath  Fever of  100F or higher  Black, tarry-looking stools  For urgent or emergent issues, a gastroenterologist can be reached at any hour by calling [phone].   DIET:  We do recommend a small meal at first, but then you may proceed to your regular diet.  Drink plenty of fluids but you should avoid alcoholic beverages for 24 hours.  ACTIVITY:  You should plan to take it easy for the rest of today and you should NOT DRIVE or use heavy machinery until tomorrow (because of the sedation medicines used during the test).    FOLLOW UP: Our staff will call the number listed on your records 48-72 hours following your procedure to check on you and address any questions or concerns that you may have regarding the information given to you following your procedure. If we do not reach you, we will leave a message.  We will attempt to reach you two times.  During this call, we will ask if you have developed any symptoms of COVID 19. If you develop any symptoms (ie: fever, flu-like symptoms, shortness of breath, cough etc.) before then, please call [phone].  If you test positive for Covid 19 in the 2 weeks post procedure, please call and report this information to [place].    If any biopsies were taken you will be contacted by phone or by letter within the next 1-3 weeks.  Please call us at [phone] if you have not heard about the biopsies in 3 weeks.    SIGNATURES/CONFIDENTIALITY: You and/or your care partner have signed paperwork which will be entered into your electronic medical record.  These signatures attest to the fact that that the information above on your After Visit Summary has been reviewed and is understood.  Full responsibility of the confidentiality of this discharge information lies with you and/or your care-partner.    Try IB GARD before meals instead of Dicyclomine You may resume your current medications today. Call and make an appointment for follow up visit 3-4 weeks. Please call if any  questions or concerns.

## 2018-11-14 NOTE — Progress Notes (Signed)
Report given to PACU, vss 

## 2018-11-16 ENCOUNTER — Telehealth: Payer: Self-pay | Admitting: *Deleted

## 2018-11-16 NOTE — Telephone Encounter (Signed)
Pt returned your call and said she is doing good from her procedure

## 2018-11-16 NOTE — Telephone Encounter (Signed)
  Follow up Call-  Call back number 11/14/2018  Post procedure Call Back phone  # [phone]  Permission to leave phone message Yes  Some recent data might be hidden   LMOM  Also told pt to call back if any of the following are a yes- 1. Have you developed a fever since your procedure?   2.   Have you had an respiratory symptoms (SOB or cough) since your procedure?   3.   Have you tested positive for COVID 19 since your procedure  4.   Have you had any family members/close contacts diagnosed with the COVID 19 since your procedure?

## 2018-11-16 NOTE — Telephone Encounter (Signed)
  Follow up Call-  Call back number 11/14/2018  Post procedure Call Back phone  # [phone]  Permission to leave phone message Yes  Some recent data might be hidden    [facility]

## 2019-01-11 ENCOUNTER — Other Ambulatory Visit: Payer: Self-pay | Admitting: Obstetrics and Gynecology

## 2019-09-22 ENCOUNTER — Encounter (HOSPITAL_COMMUNITY): Payer: Self-pay | Admitting: Emergency Medicine

## 2019-09-22 ENCOUNTER — Emergency Department (HOSPITAL_COMMUNITY)
Admission: EM | Admit: 2019-09-22 | Discharge: 2019-09-22 | Disposition: A | Discharge status: Home / Self Care | Payer: 59 | Attending: Emergency Medicine | Admitting: Emergency Medicine

## 2019-09-22 ENCOUNTER — Emergency Department (HOSPITAL_COMMUNITY): Payer: 59

## 2019-09-22 ENCOUNTER — Other Ambulatory Visit: Payer: Self-pay

## 2019-09-22 DIAGNOSIS — Z79899 Other long term (current) drug therapy: Secondary | ICD-10-CM | POA: Insufficient documentation

## 2019-09-22 DIAGNOSIS — R079 Chest pain, unspecified: Secondary | ICD-10-CM | POA: Insufficient documentation

## 2019-09-22 LAB — TROPONIN I (HIGH SENSITIVITY)
Troponin I (High Sensitivity): 3 ng/L (ref <18)
Troponin I (High Sensitivity): 5 ng/L (ref <18)

## 2019-09-22 LAB — CBC
HCT: 39.8 % (ref 36.0–46.0)
Hemoglobin: 13.4 g/dL (ref 12.0–15.0)
MCH: 29.7 pg (ref 26.0–34.0)
MCHC: 33.7 g/dL (ref 30.0–36.0)
MCV: 88.2 fL (ref 80.0–100.0)
Platelets: 190 10*3/uL (ref 150–400)
RBC: 4.51 MIL/uL (ref 3.87–5.11)
RDW: 11.7 % (ref 11.5–15.5)
WBC: 7 10*3/uL (ref 4.0–10.5)
nRBC: 0 % (ref 0.0–0.2)

## 2019-09-22 LAB — BASIC METABOLIC PANEL
Anion gap: 11 (ref 5–15)
BUN: 6 mg/dL (ref 6–20)
CO2: 26 mmol/L (ref 22–32)
Calcium: 9.6 mg/dL (ref 8.9–10.3)
Chloride: 104 mmol/L (ref 98–111)
Creatinine, Ser: 0.64 mg/dL (ref 0.44–1.00)
GFR calc Af Amer: >60 mL/min (ref >60)
GFR calc non Af Amer: >60 mL/min (ref >60)
Glucose, Bld: 95 mg/dL (ref 70–99)
Potassium: 3.7 mmol/L (ref 3.5–5.1)
Sodium: 141 mmol/L (ref 135–145)

## 2019-09-22 LAB — D-DIMER, QUANTITATIVE: D-Dimer, Quant: <0.27 ug/mL-FEU (ref 0.00–0.50)

## 2019-09-22 MED ORDER — ASPIRIN 81 MG PO CHEW
324.0000 mg | CHEWABLE_TABLET | Freq: Once | ORAL | Status: AC
  Administered 2019-09-22: 324 mg via ORAL
  Filled 2019-09-22: qty 4

## 2019-09-22 NOTE — ED Triage Notes (Signed)
Patient c/o mid/left chest pain radiating into left arm onset of an hour ago. describes it as a stabbing pain from a knife. Also having shortness of breath.

## 2019-09-22 NOTE — ED Provider Notes (Signed)
Helen Cook   CSN: [id number] Arrival date & time: 09/22/19  1035     History Chief Complaint  Patient presents with  . Chest Pain    Helen Cook is a 59 y.o. female.  She is presenting with stabbing left mid chest pain radiating into her left arm that started at around 10 AM today.  She has never had this before.  She said her left arm also felt a little numb.  It was associated with some shortness of breath.  Symptoms are improved but not completely resolved.  She has a family history of cardiac disease but no personal history of cardiac disease.  She denies any recent injury.  She does work a slightly physical job.  The history is provided by the patient.  Chest Pain Pain location:  Substernal area and L chest Pain quality: stabbing   Pain radiates to:  L shoulder Pain severity:  Moderate Onset quality:  Sudden Duration:  7 hours Timing:  Intermittent Progression:  Partially resolved Chronicity:  New Relieved by:  None tried Worsened by:  Nothing Ineffective treatments:  None tried Associated symptoms: nausea and shortness of breath   Associated symptoms: no abdominal pain, no altered mental status, no back pain, no cough, no diaphoresis, no dizziness, no fever and no vomiting   Risk factors: no aortic disease, no coronary artery disease, no diabetes mellitus, no high cholesterol, no hypertension, no prior DVT/PE and no smoking     HPI: A 59 year old patient presents for evaluation of chest pain. Initial onset of pain was approximately 3-6 hours ago. The patient's chest pain is sharp and is not worse with exertion. The patient complains of nausea. The patient's chest pain is middle- or left-sided, is not well-localized, is not described as heaviness/pressure/tightness and does radiate to the arms/jaw/neck. The patient denies diaphoresis. The patient has no history of stroke, has no history of peripheral artery disease,  has not smoked in the past 90 days, denies any history of treated diabetes, has no relevant family history of coronary artery disease (first degree relative at less than age 76), is not hypertensive, has no history of hypercholesterolemia and does not have an elevated BMI (>=30).   Past Medical History:  Diagnosis Date  . Abdominal pain, RUQ    RUQ and also radiates to the center of abdomen   . Allergy   . Arthritis    hip  . IBS (irritable bowel syndrome)     Patient Active Problem List   Diagnosis Date Noted  . Seasonal and perennial allergic rhinitis 09/16/2017  . Allergic conjunctivitis 09/16/2017  . IRRITABLE BOWEL SYNDROME 09/26/2009  . LIVER FUNCTION TESTS, ABNORMAL, HX OF 09/26/2009    Past Surgical History:  Procedure Laterality Date  . COLONOSCOPY  2018  . DILATION AND CURETTAGE OF UTERUS    . ESOPHAGOGASTRODUODENOSCOPY  2012  . NOVASURE ABLATION  05/22/10  . POLYPECTOMY    . TONSILLECTOMY    . UPPER GASTROINTESTINAL ENDOSCOPY    . WISDOM TOOTH EXTRACTION       OB History   No obstetric history on file.     Family History  Problem Relation Age of Onset  . Colon polyps Mother   . Allergic rhinitis Father   . Colon polyps Father   . Liver disease Father   . Irritable bowel syndrome Father   . Migraines Daughter   . Stomach cancer Maternal Aunt   . Colon cancer Maternal Aunt   .  Breast cancer Maternal Grandmother   . Diabetes Maternal Grandmother   . Diabetes Maternal Grandfather   . Diabetes Paternal Grandmother   . Diabetes Paternal Grandfather   . Asthma Neg Hx   . Eczema Neg Hx   . Urticaria Neg Hx   . Immunodeficiency Neg Hx   . Angioedema Neg Hx   . Esophageal cancer Neg Hx   . Rectal cancer Neg Hx     Social History   Tobacco Use  . Smoking status: Never Smoker  . Smokeless tobacco: Never Used  Substance Use Topics  . Alcohol use: No  . Drug use: No    Home Medications Prior to Admission medications   Medication Sig Start Date End  Date Taking? Authorizing Provider  Calcium Carbonate-Vitamin D (CALCIUM + D PO) Take 1 tablet by mouth daily.     [provider]  dicyclomine (BENTYL) 20 MG tablet Take 1 tablet (20 mg total) by mouth 3 (three) times daily before meals. 08/15/18   Gatha Mayer, MD  fluticasone (FLONASE) 50 MCG/ACT nasal spray Place 1 spray into both nostrils 2 (two) times daily. 09/21/17   Bobbitt, [place] Muta, MD  ipratropium (ATROVENT) 0.03 % nasal spray Place 2 sprays into both nostrils 2 (two) times daily. 06/11/16   Scot Jun, FNP  levocetirizine (XYZAL) 5 MG tablet Take 1 tablet (5 mg total) by mouth daily as needed for allergies. 09/16/17   Bobbitt, [place] Muta, MD  mometasone (NASONEX) 50 MCG/ACT nasal spray Use 1 spray per nostril 1-2 times daily as needed Patient not taking: Reported on 11/14/2018 09/16/17   Bobbitt, [place] Muta, MD  Olopatadine HCl (PATADAY) 0.2 % SOLN Place 1 drop into both eyes 1 day or 1 dose. Place one drop each eye one time a day 09/22/17   Bobbitt, [place] Muta, MD  Olopatadine HCl (PAZEO) 0.7 % SOLN Place 1 drop into both eyes daily as needed. 09/16/17   Bobbitt, Sedalia Muta, MD  prednisoLONE acetate (PRED FORTE) 1 % ophthalmic suspension prednisolone acetate 1 % eye drops,suspension  place 4 drops into each ear twice a day if needed for itching    [provider]    Allergies    Codeine and Penicillins  Review of Systems   Review of Systems  Constitutional: Negative for diaphoresis and fever.  HENT: Negative for sore throat.   Eyes: Negative for visual disturbance.  Respiratory: Positive for shortness of breath. Negative for cough.   Cardiovascular: Positive for chest pain.  Gastrointestinal: Positive for nausea. Negative for abdominal pain and vomiting.  Genitourinary: Negative for dysuria.  Musculoskeletal: Negative for back pain.  Skin: Negative for rash.  Neurological: Negative for dizziness.    Physical Exam Updated Vital Signs BP  116/79 (BP Location: Left Arm)   Pulse 69   Temp 97.7 F (36.5 C) (Oral)   Resp 16   Ht 5\' 4"  (1.626 m)   Wt 59 kg   SpO2 100%   BMI 22.31 kg/m   Physical Exam Vitals and nursing Cook reviewed.  Constitutional:      General: She is not in acute distress.    Appearance: She is well-developed.  HENT:     Head: Normocephalic and atraumatic.  Eyes:     Conjunctiva/sclera: Conjunctivae normal.  Cardiovascular:     Rate and Rhythm: Normal rate and regular rhythm.     Heart sounds: Normal heart sounds. No murmur.  Pulmonary:     Effort: Pulmonary effort is normal. No respiratory  distress.     Breath sounds: Normal breath sounds.  Abdominal:     Palpations: Abdomen is soft.     Tenderness: There is no abdominal tenderness.  Musculoskeletal:        General: Normal range of motion.     Cervical back: Neck supple.     Right lower leg: No tenderness. No edema.     Left lower leg: No tenderness. No edema.  Skin:    General: Skin is warm and dry.     Capillary Refill: Capillary refill takes less than 2 seconds.  Neurological:     General: No focal deficit present.     Mental Status: She is alert.     ED Results / Procedures / Treatments   Labs (all labs ordered are listed, but only abnormal results are displayed) Labs Reviewed  BASIC METABOLIC PANEL  CBC  D-DIMER, QUANTITATIVE (NOT AT [facility])  TROPONIN I (HIGH SENSITIVITY)  TROPONIN I (HIGH SENSITIVITY)  TROPONIN I (HIGH SENSITIVITY)    EKG EKG Interpretation  Date/Time:  Friday September 22 2019 10:39:13 EDT Ventricular Rate:  70 PR Interval:  154 QRS Duration: 80 QT Interval:  392 QTC Calculation: 423 R Axis:   68 Text Interpretation: Normal sinus rhythm Normal ECG No old tracing to compare Confirmed by Aletta Edouard [phone]) on 09/22/2019 4:47:27 PM   Radiology DG Chest 2 View  Result Date: 09/22/2019 CLINICAL DATA:  Chest pain EXAM: CHEST - 2 VIEW COMPARISON:  None. FINDINGS: The heart size and mediastinal  contours are within normal limits. Both lungs are clear. No pleural effusion or pneumothorax. The visualized skeletal structures are unremarkable. IMPRESSION: No acute process in the chest. Electronically Signed   By: Macy Mis M.D.   On: 09/22/2019 11:35    Procedures Procedures (including critical care time)  Medications Ordered in ED Medications  aspirin chewable tablet 324 mg (324 mg Oral Given 09/22/19 1800)    ED Course  I have reviewed the triage vital signs and the nursing notes.  Pertinent labs & imaging results that were available during my care of the patient were reviewed by me and considered in my medical decision making (see chart for details).  Clinical Course as of Sep 22 2215  Fri Sep 22, 2019  1817 Delta troponin minimally changed from 3-5.  D-dimer normal.  Will review with patient.   [MB]  [id number] I reviewed the patient's work-up with her and she is comfortable with discharge.Will be important for her to follow-up with her primary care doctor for further evaluation.  We also discussed return instructions.   [MB]    Clinical Course User Index [MB] Hayden Rasmussen, MD   MDM Rules/Calculators/A&P HEAR Score: 2                    This patient complains of chest pain sharp in nature associated with some shortness of breath nausea; this involves an extensive number of treatment Options and is a complaint that carries with it a high risk of complications and Morbidity. The differential includes ACS, PE, musculoskeletal, pneumothorax, GERD, metabolic derangement  I ordered, reviewed and interpreted labs, which included normal CBC normal chemistry first troponin 3.  I ordered medication aspirin I ordered imaging studies which included chest x-ray and I independently    visualized and interpreted imaging which showed Normal heart size no pneumothorax Previous records obtained and reviewed prior records in epic   After the interventions stated above, I reevaluated  the patient  and found her chest pain to be improved.  We discussed the limitations of the tests we have done here today and I recommended that she follow-up with her doctor.  Final Clinical Impression(s) / ED Diagnoses Final diagnoses:  Nonspecific chest pain    Rx / DC Orders ED Discharge Orders    None       Hayden Rasmussen, MD 09/22/19 2218

## 2019-09-22 NOTE — ED Notes (Signed)
Patient verbalizes understanding of discharge instructions. Opportunity for questioning and answers were provided. Armband removed by staff, pt discharged from ED ambulatory.   

## 2019-09-22 NOTE — Discharge Instructions (Addendum)
You were evaluated in the emergency department for chest pain.  You had blood work EKG and a chest x-ray that did not show any serious findings.  It will be important for you to contact your primary care doctor for close follow-up.  Please return to the emergency department if any worsening or concerning symptoms.

## 2020-07-05 ENCOUNTER — Other Ambulatory Visit: Payer: Self-pay | Admitting: Obstetrics and Gynecology

## 2020-07-05 DIAGNOSIS — R5381 Other malaise: Secondary | ICD-10-CM

## 2020-11-23 IMAGING — CT CT ABD-PELV W/ CM
2 of 5 series · 16 of 46 positions shown, 18 images · IV contrast (omnipaque)
Comparison: 11/02/2014

CLINICAL DATA: Upper chest and right abdominal pain. Irritable
bowel syndrome. Right lower quadrant tenderness. Epigastric pain.
Weight loss.

EXAM:
CT ABDOMEN AND PELVIS WITH CONTRAST
TECHNIQUE: Multidetector CT imaging of the abdomen and pelvis was performed
using the standard protocol following bolus administration of
intravenous contrast.
CONTRAST:  100mL OMNIPAQUE IOHEXOL 300 MG/ML  SOLN

[Series 2: abd/pel w · axial · 0.68mm/px · z∈[+572,+997]mm · 13 of 97 slices shown, 15 images]
[im 6/97  soft-tissue]
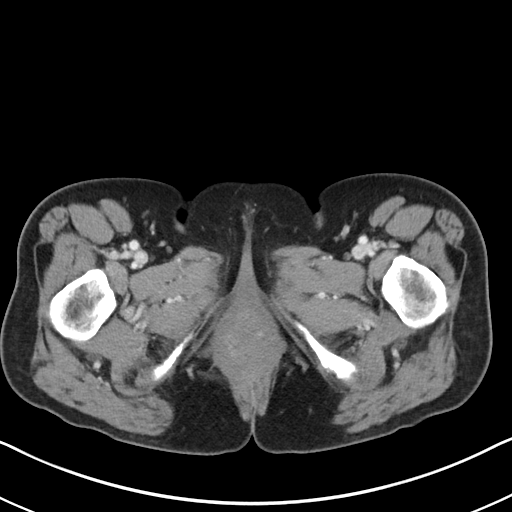
[im 6/97  bone]
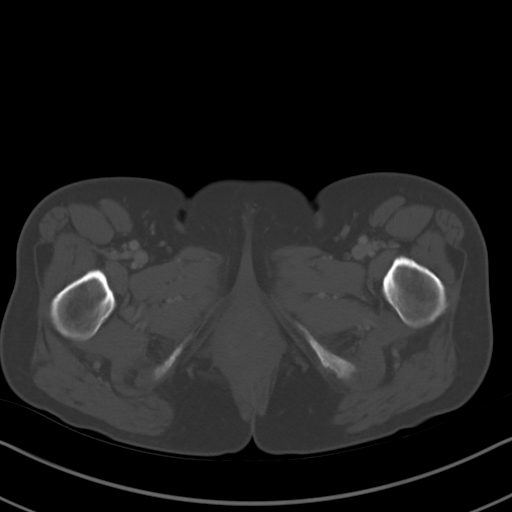
[im 11/97  soft-tissue]
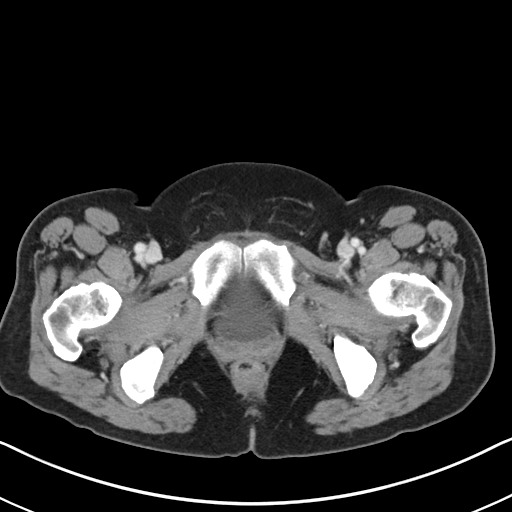
[im 22/97  soft-tissue]
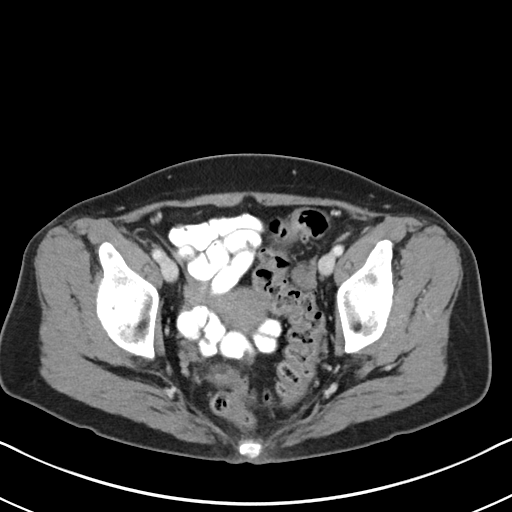
[im 27/97  soft-tissue]
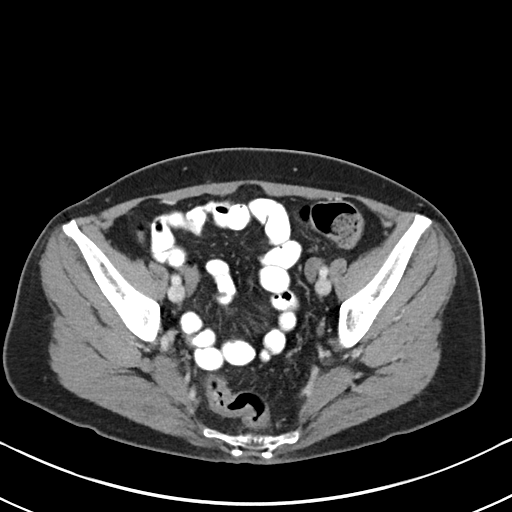
[im 33/97  soft-tissue]
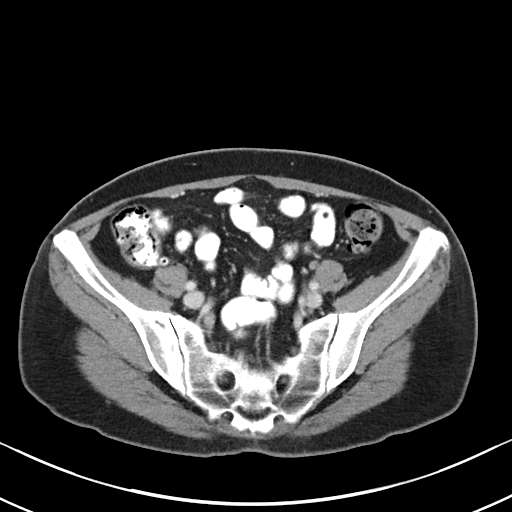
[im 43/97  soft-tissue]
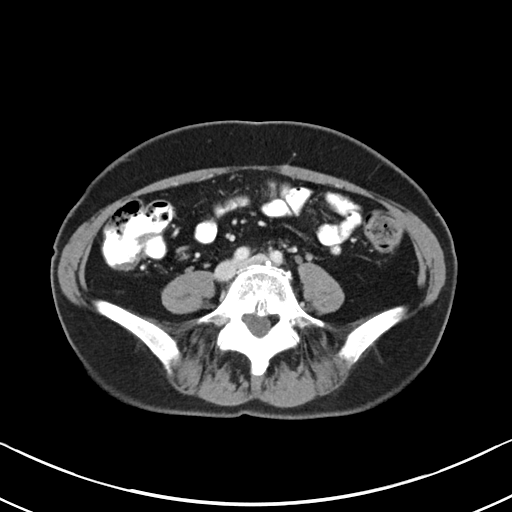
[im 49/97  soft-tissue]
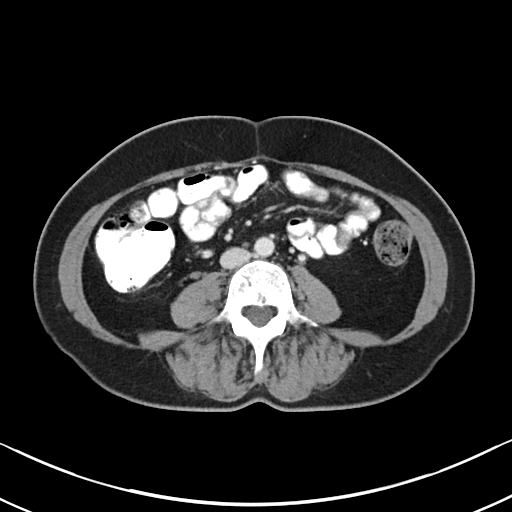
[im 54/97  soft-tissue]
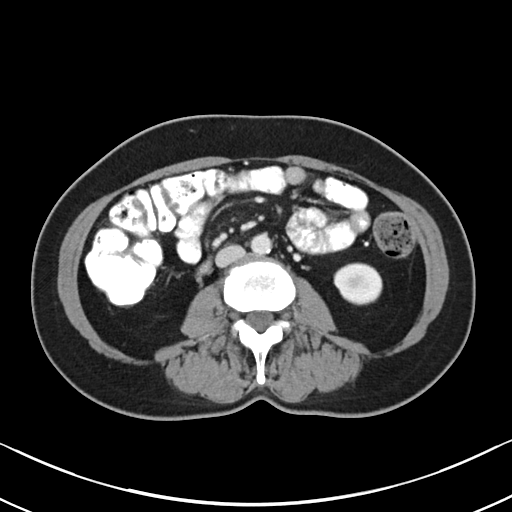
[im 65/97  soft-tissue]
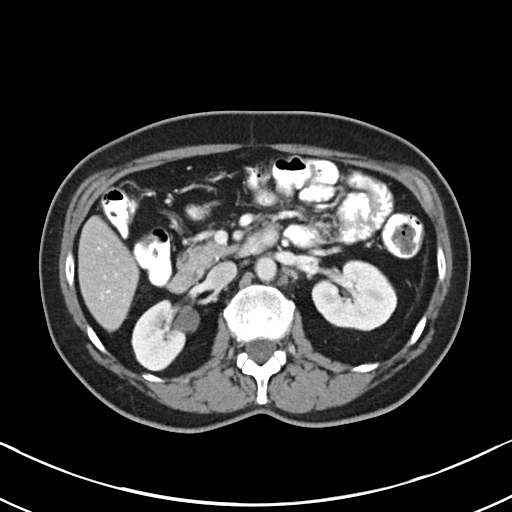
[im 65/97  bone]
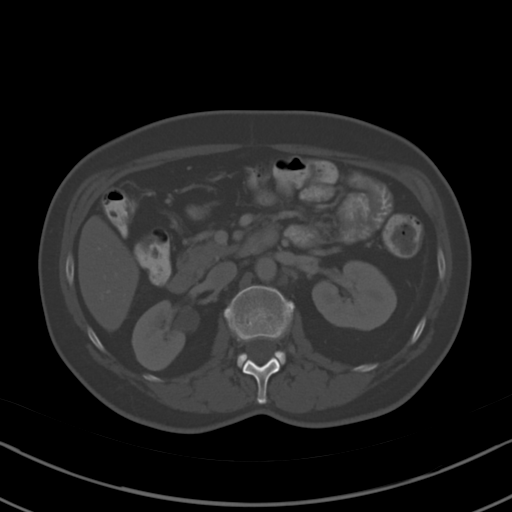
[im 70/97  soft-tissue]
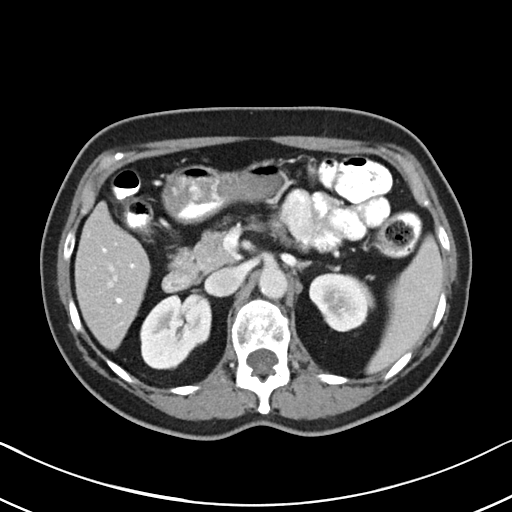
[im 75/97  soft-tissue]
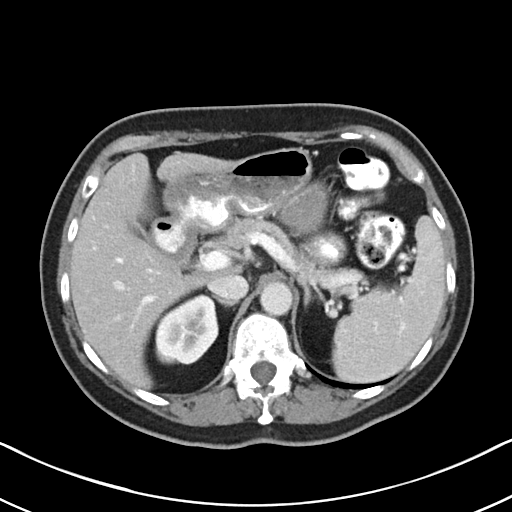
[im 86/97  soft-tissue]
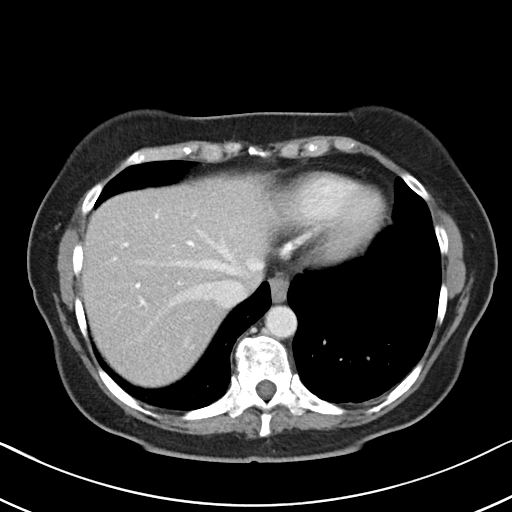
[im 91/97  soft-tissue]
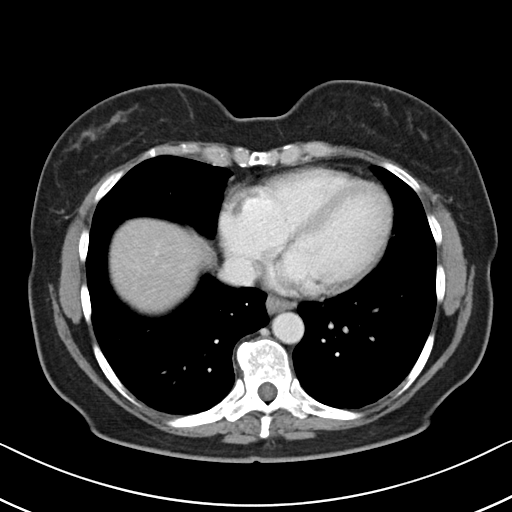

[Series 5: abd/pel w st · coronal · 0.79mm/px · 3 of 71 slices shown]
[im 24/71  soft-tissue]
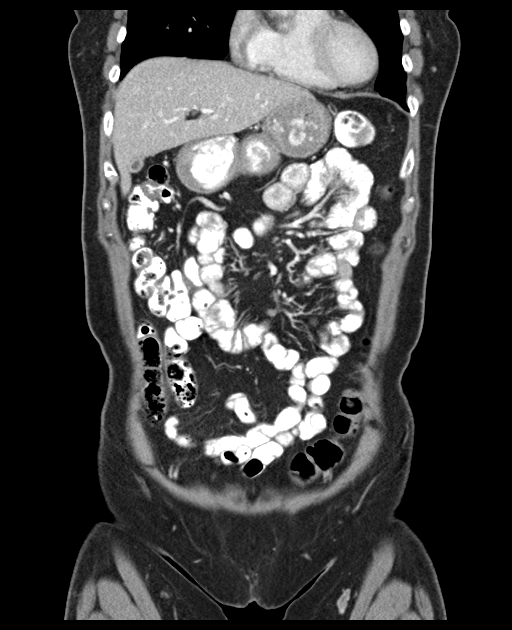
[im 32/71  soft-tissue]
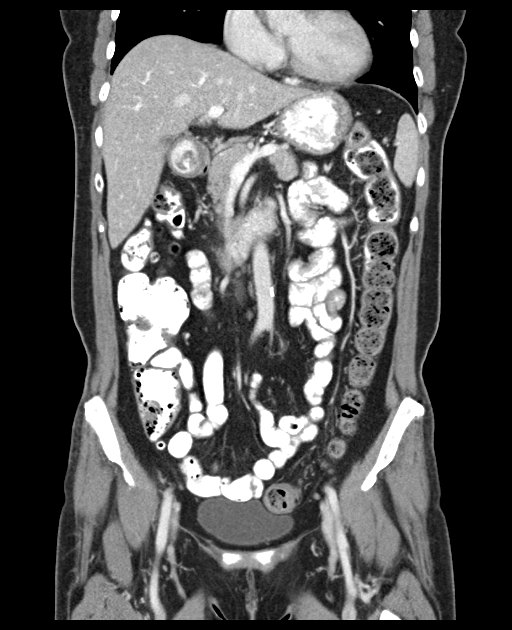
[im 39/71  soft-tissue]
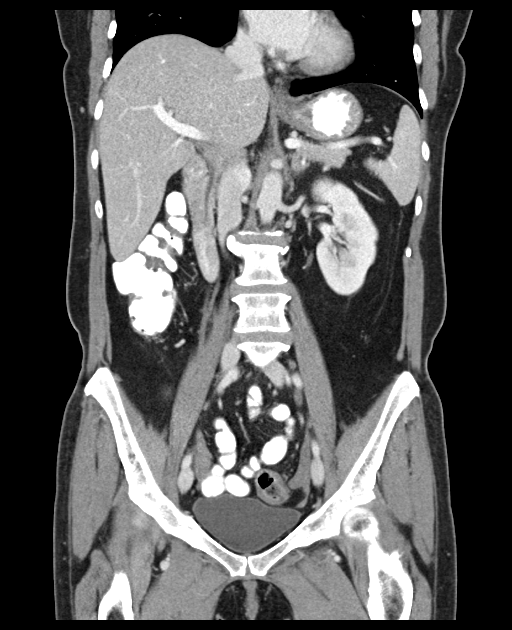

[16 of 46 positions shown; findings below may reference images not displayed]

FINDINGS: Lower chest: Clear lung bases. Normal heart size without pericardial
or pleural effusion.

Hepatobiliary: Normal liver. Normal gallbladder, without biliary
ductal dilatation.

Pancreas: Normal, without mass or ductal dilatation.

Spleen: Normal in size, without focal abnormality.

Adrenals/Urinary Tract: Normal adrenal glands. Mildly scarred upper
pole left kidney. Normal right kidney, without hydronephrosis or
hydroureter.

Trace air in the nondependent urinary bladder on image 80/2.

Stomach/Bowel: Normal stomach, without wall thickening. Normal
colon, appendix, and terminal ileum. Normal small bowel.

Vascular/Lymphatic: Aortic and branch vessel atherosclerosis. No
abdominopelvic adenopathy.

Reproductive: Normal uterus and adnexa.

Other: No significant free fluid.

Musculoskeletal: No acute osseous abnormality.
IMPRESSION: 1. No acute process in the abdomen or pelvis. No explanation for
patient's symptoms.
2. Trace air in the urinary bladder. Correlate with instrumentation.
3.  Aortic Atherosclerosis ([id number]).

## 2021-01-13 ENCOUNTER — Other Ambulatory Visit: Payer: Self-pay

## 2021-01-13 ENCOUNTER — Ambulatory Visit: Payer: 59 | Admitting: Podiatry

## 2021-01-13 ENCOUNTER — Ambulatory Visit (INDEPENDENT_AMBULATORY_CARE_PROVIDER_SITE_OTHER): Payer: 59

## 2021-01-13 ENCOUNTER — Encounter: Payer: Self-pay | Admitting: Podiatry

## 2021-01-13 DIAGNOSIS — M79671 Pain in right foot: Secondary | ICD-10-CM | POA: Diagnosis not present

## 2021-01-13 DIAGNOSIS — M79672 Pain in left foot: Secondary | ICD-10-CM | POA: Diagnosis not present

## 2021-01-13 DIAGNOSIS — M722 Plantar fascial fibromatosis: Secondary | ICD-10-CM

## 2021-01-13 MED ORDER — MELOXICAM 7.5 MG PO TABS: 7.5000 mg | ORAL_TABLET | Freq: Every day | ORAL | 0 refills | Status: DC | PRN

## 2021-01-13 NOTE — Patient Instructions (Signed)
For instructions on how to put on your Plantar Fascial Brace, please visit [email or web address]   Plantar Fasciitis (Heel Spur Syndrome) with Rehab The plantar fascia is a fibrous, ligament-like, soft-tissue structure that spans the bottom of the foot. Plantar fasciitis is a condition that causes pain in the foot due to inflammation of the tissue. SYMPTOMS   Pain and tenderness on the underneath side of the foot.  Pain that worsens with standing or walking. CAUSES  Plantar fasciitis is caused by irritation and injury to the plantar fascia on the underneath side of the foot. Common mechanisms of injury include:  Direct trauma to bottom of the foot.  Damage to a small nerve that runs under the foot where the main fascia attaches to the heel bone.  Stress placed on the plantar fascia due to bone spurs. RISK INCREASES WITH:   Activities that place stress on the plantar fascia (running, jumping, pivoting, or cutting).  Poor strength and flexibility.  Improperly fitted shoes.  Tight calf muscles.  Flat feet.  Failure to warm-up properly before activity.  Obesity. PREVENTION  Warm up and stretch properly before activity.  Allow for adequate recovery between workouts.  Maintain physical fitness:  Strength, flexibility, and endurance.  Cardiovascular fitness.  Maintain a health body weight.  Avoid stress on the plantar fascia.  Wear properly fitted shoes, including arch supports for individuals who have flat feet.  PROGNOSIS  If treated properly, then the symptoms of plantar fasciitis usually resolve without surgery. However, occasionally surgery is necessary.  RELATED COMPLICATIONS   Recurrent symptoms that may result in a chronic condition.  Problems of the lower back that are caused by compensating for the injury, such as limping.  Pain or weakness of the foot during push-off following surgery.  Chronic inflammation, scarring, and partial or complete  fascia tear, occurring more often from repeated injections.  TREATMENT  Treatment initially involves the use of ice and medication to help reduce pain and inflammation. The use of strengthening and stretching exercises may help reduce pain with activity, especially stretches of the Achilles tendon. These exercises may be performed at home or with a therapist. Your caregiver may recommend that you use heel cups of arch supports to help reduce stress on the plantar fascia. Occasionally, corticosteroid injections are given to reduce inflammation. If symptoms persist for greater than 6 months despite non-surgical (conservative), then surgery may be recommended.   MEDICATION   If pain medication is necessary, then nonsteroidal anti-inflammatory medications, such as aspirin and ibuprofen, or other minor pain relievers, such as acetaminophen, are often recommended.  Do not take pain medication within 7 days before surgery.  Prescription pain relievers may be given if deemed necessary by your caregiver. Use only as directed and only as much as you need.  Corticosteroid injections may be given by your caregiver. These injections should be reserved for the most serious cases, because they may only be given a certain number of times.  HEAT AND COLD  Cold treatment (icing) relieves pain and reduces inflammation. Cold treatment should be applied for 10 to 15 minutes every 2 to 3 hours for inflammation and pain and immediately after any activity that aggravates your symptoms. Use ice packs or massage the area with a piece of ice (ice massage).  Heat treatment may be used prior to performing the stretching and strengthening activities prescribed by your caregiver, physical therapist, or athletic trainer. Use a heat pack or soak the injury in warm water.  SEEK IMMEDIATE MEDICAL   CARE IF:  Treatment seems to offer no benefit, or the condition worsens.  Any medications produce adverse side effects.   EXERCISES- RANGE OF MOTION (ROM) AND STRETCHING EXERCISES - Plantar Fasciitis (Heel Spur Syndrome) These exercises may help you when beginning to rehabilitate your injury. Your symptoms may resolve with or without further involvement from your physician, physical therapist or athletic trainer. While completing these exercises, remember:   Restoring tissue flexibility helps normal motion to return to the joints. This allows healthier, less painful movement and activity.  An effective stretch should be held for at least 30 seconds.  A stretch should never be painful. You should only feel a gentle lengthening or release in the stretched tissue.  RANGE OF MOTION - Toe Extension, Flexion  Sit with your right / left leg crossed over your opposite knee.  Grasp your toes and gently pull them back toward the top of your foot. You should feel a stretch on the bottom of your toes and/or foot.  Hold this stretch for 10 seconds.  Now, gently pull your toes toward the bottom of your foot. You should feel a stretch on the top of your toes and or foot.  Hold this stretch for 10 seconds. Repeat  times. Complete this stretch 3 times per day.   RANGE OF MOTION - Ankle Dorsiflexion, Active Assisted  Remove shoes and sit on a chair that is preferably not on a carpeted surface.  Place right / left foot under knee. Extend your opposite leg for support.  Keeping your heel down, slide your right / left foot back toward the chair until you feel a stretch at your ankle or calf. If you do not feel a stretch, slide your bottom forward to the edge of the chair, while still keeping your heel down.  Hold this stretch for 10 seconds. Repeat 3 times. Complete this stretch 2 times per day.   STRETCH  Gastroc, Standing  Place hands on wall.  Extend right / left leg, keeping the front knee somewhat bent.  Slightly point your toes inward on your back foot.  Keeping your right / left heel on the floor and your  knee straight, shift your weight toward the wall, not allowing your back to arch.  You should feel a gentle stretch in the right / left calf. Hold this position for 10 seconds. Repeat 3 times. Complete this stretch 2 times per day.  STRETCH  Soleus, Standing  Place hands on wall.  Extend right / left leg, keeping the other knee somewhat bent.  Slightly point your toes inward on your back foot.  Keep your right / left heel on the floor, bend your back knee, and slightly shift your weight over the back leg so that you feel a gentle stretch deep in your back calf.  Hold this position for 10 seconds. Repeat 3 times. Complete this stretch 2 times per day.  STRETCH  Gastrocsoleus, Standing  Note: This exercise can place a lot of stress on your foot and ankle. Please complete this exercise only if specifically instructed by your caregiver.   Place the ball of your right / left foot on a step, keeping your other foot firmly on the same step.  Hold on to the wall or a rail for balance.  Slowly lift your other foot, allowing your body weight to press your heel down over the edge of the step.  You should feel a stretch in your right / left calf.  Hold this   position for 10 seconds.  Repeat this exercise with a slight bend in your right / left knee. Repeat 3 times. Complete this stretch 2 times per day.   STRENGTHENING EXERCISES - Plantar Fasciitis (Heel Spur Syndrome)  These exercises may help you when beginning to rehabilitate your injury. They may resolve your symptoms with or without further involvement from your physician, physical therapist or athletic trainer. While completing these exercises, remember:   Muscles can gain both the endurance and the strength needed for everyday activities through controlled exercises.  Complete these exercises as instructed by your physician, physical therapist or athletic trainer. Progress the resistance and repetitions only as guided.  STRENGTH -  Towel Curls  Sit in a chair positioned on a non-carpeted surface.  Place your foot on a towel, keeping your heel on the floor.  Pull the towel toward your heel by only curling your toes. Keep your heel on the floor. Repeat 3 times. Complete this exercise 2 times per day.  STRENGTH - Ankle Inversion  Secure one end of a rubber exercise band/tubing to a fixed object (table, pole). Loop the other end around your foot just before your toes.  Place your fists between your knees. This will focus your strengthening at your ankle.  Slowly, pull your big toe up and in, making sure the band/tubing is positioned to resist the entire motion.  Hold this position for 10 seconds.  Have your muscles resist the band/tubing as it slowly pulls your foot back to the starting position. Repeat 3 times. Complete this exercises 2 times per day.  Document Released: 05/25/2005 Document Revised: 08/17/2011 Document Reviewed: 09/06/2008 [employer] Patient Information 2014 [employer]. 

## 2021-01-16 NOTE — Progress Notes (Signed)
Subjective:   Patient ID: Marland Kitchen, female   DOB: 60 y.o.   MRN: [id number]   HPI 60 year old female presents the office today for concerns of heel pain on the bottom of her foot.  She states that hurts and when she gets up she has been sitting outbreak and she stands back up.  Also she works 12-hour shifts standing on hard surfaces and about after 6 to 7 hours when she gets discomfort.  She has tried over-the-counter inserts as well as new shoes from [employer] without significant improvement.  No recent injury.  No other concerns.   Review of Systems  All other systems reviewed and are negative.  Past Medical History:  Diagnosis Date   Abdominal pain, RUQ    RUQ and also radiates to the center of abdomen    Allergy    Arthritis    hip   IBS (irritable bowel syndrome)     Past Surgical History:  Procedure Laterality Date   COLONOSCOPY  2018   DILATION AND CURETTAGE OF UTERUS     ESOPHAGOGASTRODUODENOSCOPY  2012   NOVASURE ABLATION  05/22/10   POLYPECTOMY     TONSILLECTOMY     UPPER GASTROINTESTINAL ENDOSCOPY     WISDOM TOOTH EXTRACTION       Current Outpatient Medications:    meloxicam (MOBIC) 7.5 MG tablet, Take 1 tablet (7.5 mg total) by mouth daily as needed for pain., Disp: 30 tablet, Rfl: 0   CALCIUM PO, Take 1 tablet by mouth daily with breakfast. , Disp: , Rfl:    Cholecalciferol (VITAMIN D-3 PO), Take 1 capsule by mouth daily with breakfast. , Disp: , Rfl:    colestipol (COLESTID) 1 g tablet, Take 2 g by mouth daily., Disp: , Rfl:    hydrochlorothiazide (HYDRODIURIL) 12.5 MG tablet, Take 12.5 mg by mouth every morning., Disp: , Rfl:    levocetirizine (XYZAL) 5 MG tablet, Take 1 tablet (5 mg total) by mouth daily as needed for allergies., Disp: 30 tablet, Rfl: 5   lisinopril (ZESTRIL) 10 MG tablet, Take 10 mg by mouth daily., Disp: , Rfl:    loratadine (CLARITIN) 10 MG tablet, loratadine 10 mg tablet  TAKE 1 TABLET BY MOUTH DAILY, Disp: , Rfl:    Multiple  Vitamins-Minerals (ONE-A-DAY WOMENS PO), Take 1 tablet by mouth daily with breakfast., Disp: , Rfl:    rosuvastatin (CRESTOR) 10 MG tablet, Take 10 mg by mouth daily., Disp: , Rfl:   Allergies  Allergen Reactions   Codeine Hives, Itching and Other (See Comments)    Red bumps, also   Penicillins Swelling and Other (See Comments)    Swelling around the ears and eyes           Objective:  Physical Exam  General: AAO x3, NAD  Dermatological: Skin is warm, dry and supple bilateral.  There are no open sores, no preulcerative lesions, no rash or signs of infection present.  Vascular: Dorsalis Pedis artery and Posterior Tibial artery pedal pulses are 2/4 bilateral with immedate capillary fill time.There is no pain with calf compression, swelling, warmth, erythema.   Neruologic: Grossly intact via light touch bilateral.  Negative Tinel sign.  Musculoskeletal: Tenderness to palpation along the plantar medial tubercle of the calcaneus at the insertion of plantar fascia on the left and right foot. There is no pain along the course of the plantar fascia within the arch of the foot. Plantar fascia appears to be intact. There is no pain with lateral  compression of the calcaneus or pain with vibratory sensation. There is no pain along the course or insertion of the achilles tendon. No other areas of tenderness to bilateral lower extremities. Muscular strength 5/5 in all groups tested bilateral.  Equinus present.  Gait: Unassisted, Nonantalgic.       Assessment:   Bilateral heel pain, plantar fasciitis     Plan:  -Treatment options discussed including all alternatives, risks, and complications -Etiology of symptoms were discussed -X-rays were obtained and reviewed with the patient.  No evidence of acute fracture or stress fracture identified. -Prescribed mobic. Discussed side effects of the medication and directed to stop if any are to occur and call the office.  -Follow-up on steroid  injection today. -Plantar fascial brace dispensed x2 -Night splint -Stretching, icing daily.  Discussed shoe modifications and arch supports.  Discussed other types of inserts that can be beneficial for her.  Trula Slade DPM

## 2021-02-11 ENCOUNTER — Ambulatory Visit: Payer: 59 | Admitting: Podiatry

## 2022-01-03 IMAGING — CR DG CHEST 2V
2 series · 2 of 2 positions shown · non-contrast
Comparison: None.

CLINICAL DATA: Chest pain

EXAM:
CHEST - 2 VIEW

[chest pa]
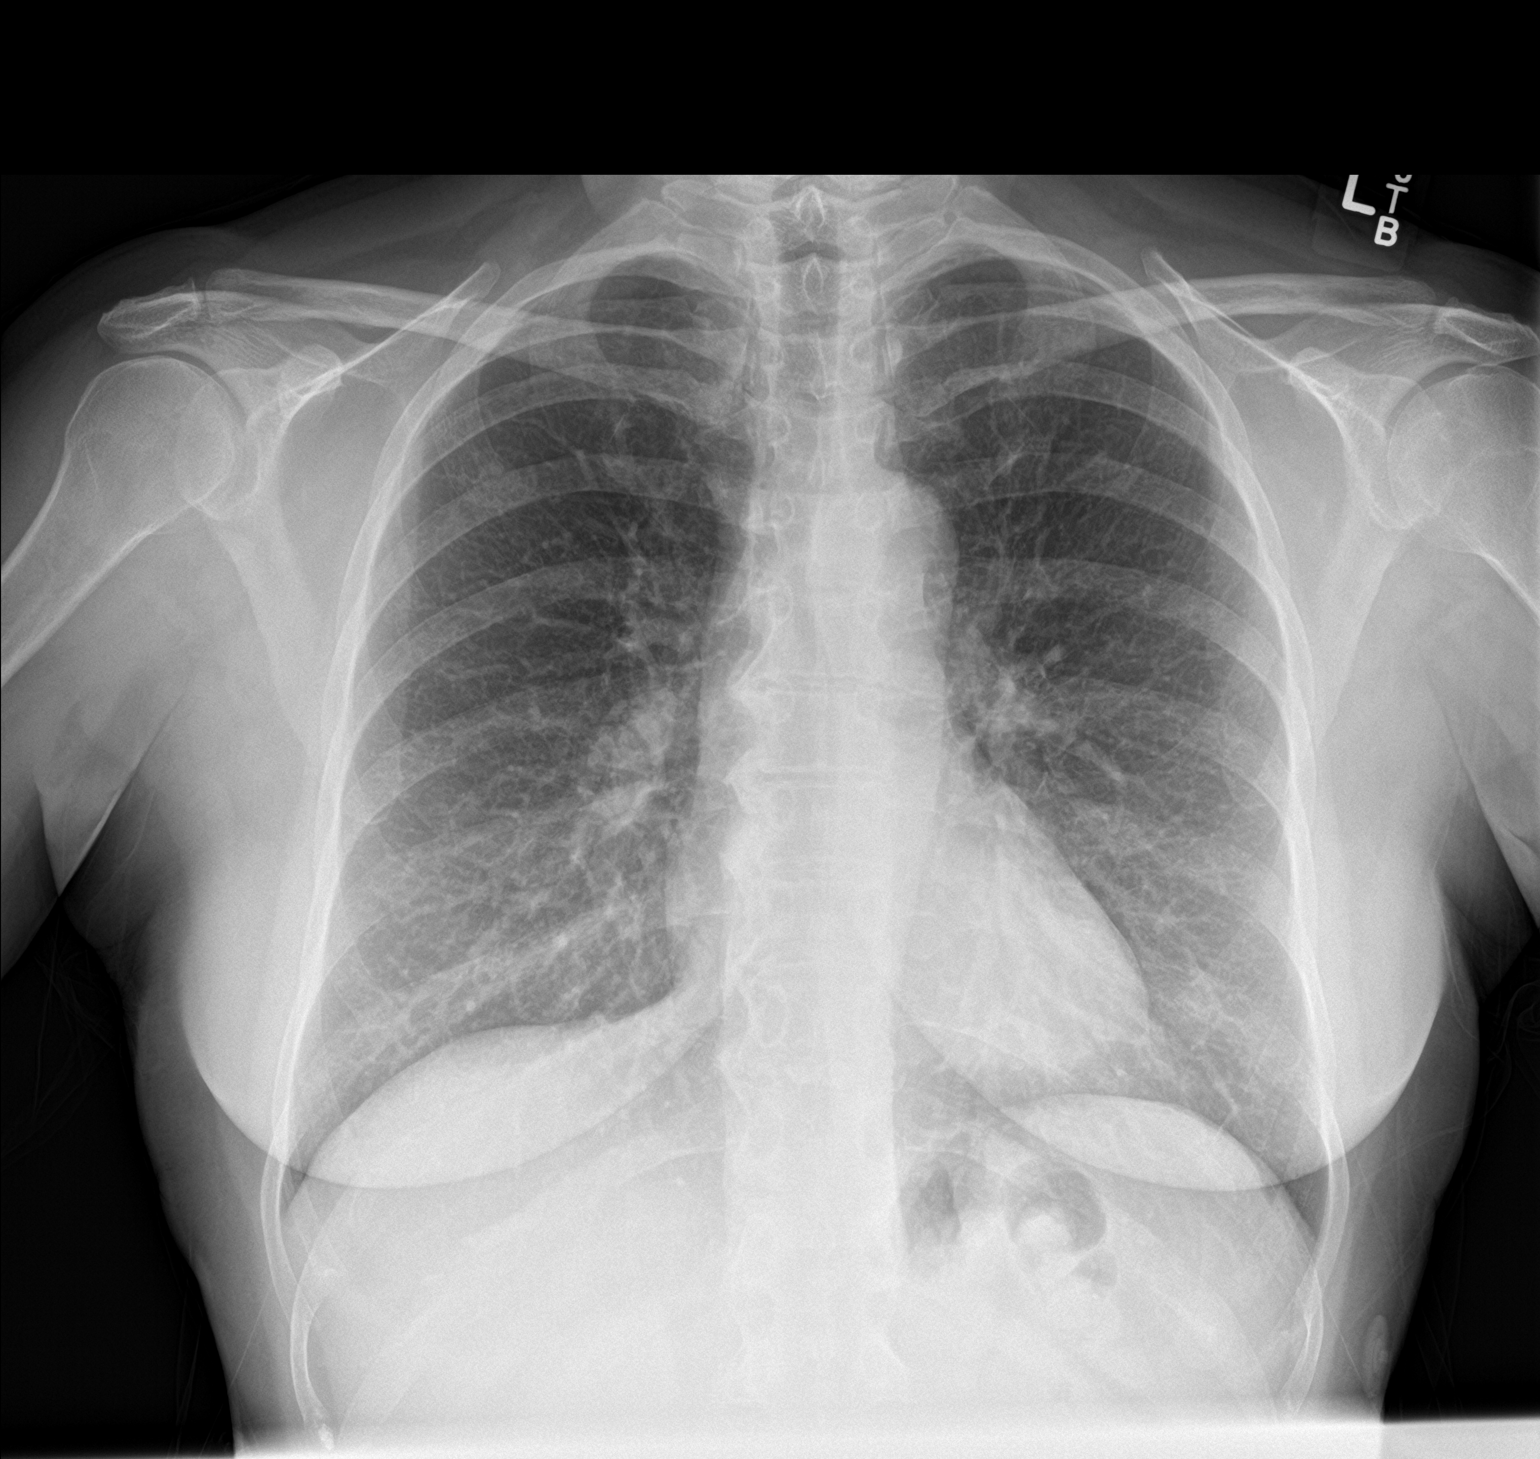

[chest lat]
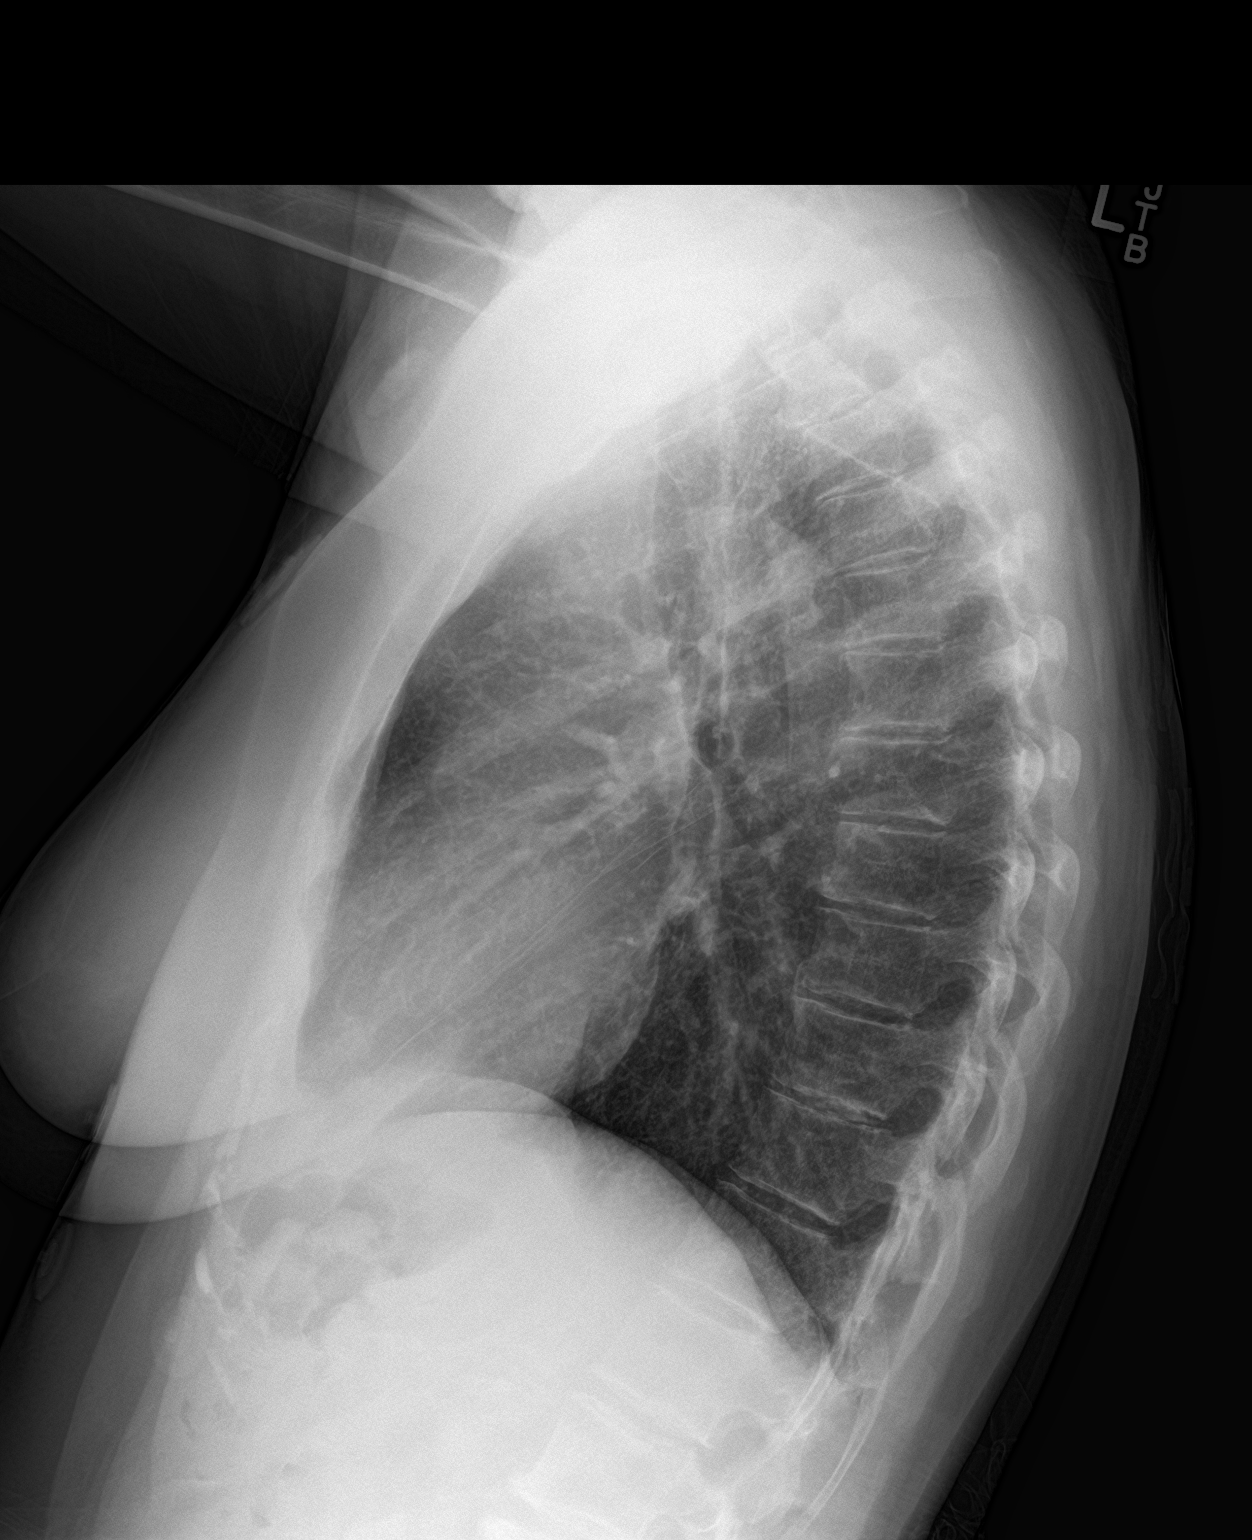

[2 of 2 positions shown; findings below may reference images not displayed]

FINDINGS: The heart size and mediastinal contours are within normal limits.
Both lungs are clear. No pleural effusion or pneumothorax. The
visualized skeletal structures are unremarkable.
IMPRESSION: No acute process in the chest.

## 2023-01-07 ENCOUNTER — Ambulatory Visit: Payer: Self-pay | Admitting: Podiatry

## 2023-01-12 ENCOUNTER — Encounter: Payer: Self-pay | Admitting: Podiatry

## 2023-01-12 ENCOUNTER — Ambulatory Visit: Payer: 59 | Admitting: Podiatry

## 2023-01-12 DIAGNOSIS — M722 Plantar fascial fibromatosis: Secondary | ICD-10-CM | POA: Diagnosis not present

## 2023-01-12 MED ORDER — METHYLPREDNISOLONE 4 MG PO TBPK: ORAL_TABLET | ORAL | 0 refills | Status: DC

## 2023-01-12 MED ORDER — CELECOXIB 200 MG PO CAPS: 200.0000 mg | ORAL_CAPSULE | Freq: Two times a day (BID) | ORAL | 3 refills | Status: DC

## 2023-01-12 MED ORDER — TRIAMCINOLONE ACETONIDE 40 MG/ML IJ SUSP
40.0000 mg | Freq: Once | INTRAMUSCULAR | Status: AC
  Administered 2023-01-12: 40 mg

## 2023-01-12 NOTE — Progress Notes (Signed)
She presents today states that her heels have gotten so much worse that she refers to the plan fasciitis.  She states that she saw Dr. Loreta Ave back in 2022 and since then her heels may have improved for short period but have progressed so far is affecting her ability perform her daily activities and work.  She states that she is try icing 3 times a day sometimes 5 times a day and nothing really seems to be helping.  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  Objective: Vital signs are stable alert and oriented x 3.  Pulses are palpable.  Neurologic sensorium is intact deep tendon reflexes are intact muscle strength is normal and symmetrical.  Orthopedic evaluation of straits all joints distal to the ankle full range of motion without crepitus she has hard firm warm nodular masses to the plantar medial aspect of the bilateral heel consistent with severe longstanding plantar fasciitis.  Assessment: Pain in limb secondary to chronic intractable plantar fasciitis.  Plan: Discussed etiology pathology conservative surgical therapies at this point were going to schedule her with [facility] to have her orthotics made.  Were also going to reinject the heels today start her on methylprednisolone to be followed by Celebrex.

## 2023-01-28 ENCOUNTER — Telehealth: Payer: Self-pay | Admitting: Podiatry

## 2023-01-28 ENCOUNTER — Ambulatory Visit (INDEPENDENT_AMBULATORY_CARE_PROVIDER_SITE_OTHER): Payer: 59

## 2023-01-28 DIAGNOSIS — M722 Plantar fascial fibromatosis: Secondary | ICD-10-CM | POA: Diagnosis not present

## 2023-01-28 NOTE — Telephone Encounter (Signed)
Patient called she wants to go ahead and proceed with the order for the orthotics, she checked with her insurance and they are not covered , she will be responsible for the $490.

## 2023-01-28 NOTE — Progress Notes (Signed)
  Patient was seen, measured for custom molded foot orthotics patient has Plantar Fasciitis with Cavus foot structure  Patient will benefit from CFO's as they will help provide total contact to MLA's helping to better distribute body weight across BIL feet greater reducing plantar pressure and pain and to also encourage FF and RF alignment  Patient was scanned items to be ordered and fit when in   Patient wants to hold off on ordering until she talks to INS co.   Addison Bailey Cped, CFo, CFm

## 2023-02-09 ENCOUNTER — Telehealth: Payer: Self-pay | Admitting: Podiatry

## 2023-02-09 NOTE — Telephone Encounter (Signed)
Orthotics in.. lvm for pt to call to schedule an appt to puo.

## 2023-02-22 ENCOUNTER — Ambulatory Visit: Payer: 59

## 2023-02-22 NOTE — Progress Notes (Signed)
Patient presents today to pick up custom molded foot orthotics, diagnosed with Plantar Fasciitis by Dr. Al Corpus.   Orthotics were dispensed and fit was satisfactory. Reviewed instructions for break-in and wear. Written instructions given to patient.  Patient will follow up as needed.   Addison Bailey CPed, CFo, CFm

## 2023-05-10 ENCOUNTER — Encounter: Payer: Self-pay | Admitting: Internal Medicine

## 2023-05-18 ENCOUNTER — Encounter: Payer: Self-pay | Admitting: Podiatry

## 2023-05-18 ENCOUNTER — Ambulatory Visit: Payer: 59 | Admitting: Podiatry

## 2023-05-18 DIAGNOSIS — M722 Plantar fascial fibromatosis: Secondary | ICD-10-CM

## 2023-05-18 MED ORDER — TRIAMCINOLONE ACETONIDE 40 MG/ML IJ SUSP
20.0000 mg | Freq: Once | INTRAMUSCULAR | Status: AC
  Administered 2023-05-18: 20 mg

## 2023-05-18 MED ORDER — DICLOFENAC SODIUM 75 MG PO TBEC: 75.0000 mg | DELAYED_RELEASE_TABLET | Freq: Two times a day (BID) | ORAL | 1 refills | Status: DC

## 2023-05-18 NOTE — Progress Notes (Signed)
Current orthotics have helped patient feels like arch needs to be higher I heated orthotics and lifted them patient was happy with fit and feel of adjustment Patient has also requested to order another pair this pair will be half off $271  Order sent to [facility] today also adding arch / scaphoid pad to new orthotics  Addison Bailey CPed, CFo CFm

## 2023-05-18 NOTE — Progress Notes (Signed)
She presents today for follow-up of her bilateral plantar fasciitis states that she is having burning underneath her left foot the right foot is doing some better but does not hurt as badly as the left.  States that the orthotics are fine and they really helped a lot but she feels that the arch may not be high enough.  She states that the Celebrex bother her stomach and she was unable to take it.  Objective: Vital signs are stable alert and oriented x 3.  Pulses are palpable.  There is no erythema edema cellulitis drainage or odor.  She has moderate severe pain on palpation medial calcaneal tubercle bilateral but worse than left.  She also has tenderness on palpation of the fourth fifth tarsometatarsal joint.  Assessment: Planter fasciitis bilateral left greater than right.  Plan: Discussed etiology pathology conservative surgical therapies at this point injected the left heel today 20 mg Kenalog 5 mg Marcaine point maximal tenderness sent diclofenac 75 mg 1 p.o. daily to her pharmacy.  And I will follow-up with her in a few weeks if necessary.  We presented her to Rosann [facility] today to see if we could raise the arch on the orthotics and she would like to have another pair of orthotics.  Remember to ask how her husband is doing since he fell off the ladder in October.

## 2023-06-08 ENCOUNTER — Ambulatory Visit: Payer: 59 | Admitting: Podiatry

## 2023-06-18 ENCOUNTER — Ambulatory Visit (AMBULATORY_SURGERY_CENTER): Payer: 59

## 2023-06-18 VITALS — Ht 63.0 in | Wt 131.0 lb

## 2023-06-18 DIAGNOSIS — Z1211 Encounter for screening for malignant neoplasm of colon: Secondary | ICD-10-CM

## 2023-06-18 MED ORDER — SUTAB 1479-225-188 MG PO TABS: 12.0000 | ORAL_TABLET | ORAL | 0 refills | Status: DC

## 2023-06-18 NOTE — Progress Notes (Signed)

## 2023-06-28 ENCOUNTER — Other Ambulatory Visit: Payer: Self-pay | Admitting: Podiatry

## 2023-07-01 ENCOUNTER — Encounter: Payer: Self-pay | Admitting: Internal Medicine

## 2023-07-05 NOTE — Progress Notes (Unsigned)
[facility] History and Physical   Primary Care Physician:  Huel Cote, MD   Reason for Procedure:   Family hx colon cancerand polyps  Plan:    colonoscopy     HPI: Helen Cook is a 63 y.o. female w/ family hx colon cancer (and gastric cancer) in an aunt and both parents w/ hx colon polyps.  Father had esophageal cancer.  Last colonoscopy 2018 in [place] with diverticulosis only.  An EGD by me in 2020 was normal, I thought she probably had functional GI problems.   Past Medical History:  Diagnosis Date   Abdominal pain, RUQ    RUQ and also radiates to the center of abdomen    Allergy    Arthritis    hip   Cataract    Hyperlipidemia    Hypertension    IBS (irritable bowel syndrome)     Past Surgical History:  Procedure Laterality Date   COLONOSCOPY  2018   DILATION AND CURETTAGE OF UTERUS     ESOPHAGOGASTRODUODENOSCOPY  2012   NOVASURE ABLATION  05/22/10   POLYPECTOMY     TONSILLECTOMY     UPPER GASTROINTESTINAL ENDOSCOPY     WISDOM TOOTH EXTRACTION      Prior to Admission medications   Medication Sig Start Date End Date Taking? Authorizing Provider  CALCIUM PO Take 1 tablet by mouth daily with breakfast.     [provider]  Cholecalciferol (VITAMIN D-3 PO) Take 1 capsule by mouth daily with breakfast.     [provider]  colestipol (COLESTID) 1 g tablet Take 2 g by mouth daily. Patient not taking: Reported on 06/18/2023 09/01/20   [provider]  diclofenac (VOLTAREN) 75 MG EC tablet TAKE 1 TABLET(75 MG) BY MOUTH TWICE DAILY 06/28/23   Hyatt, Max T, DPM  hydrochlorothiazide (HYDRODIURIL) 12.5 MG tablet Take 12.5 mg by mouth every morning. 12/20/20   [provider]  levocetirizine (XYZAL) 5 MG tablet Take 1 tablet (5 mg total) by mouth daily as needed for allergies. Patient not taking: Reported on 06/18/2023 09/16/17   Bobbitt, Heywood Iles, MD  lisinopril (ZESTRIL) 10 MG tablet Take 10 mg by mouth daily.  12/20/20   [provider]  loratadine (CLARITIN) 10 MG tablet loratadine 10 mg tablet  TAKE 1 TABLET BY MOUTH DAILY Patient not taking: Reported on 06/18/2023    [provider]  meloxicam (MOBIC) 7.5 MG tablet Take 1 tablet (7.5 mg total) by mouth daily as needed for pain. 01/13/21   Vivi Barrack, DPM  Multiple Vitamins-Minerals (ONE-A-DAY WOMENS PO) Take 1 tablet by mouth daily with breakfast.    [provider]  rosuvastatin (CRESTOR) 10 MG tablet Take 10 mg by mouth daily. 10/07/20   [provider]  Sodium Sulfate-Mag Sulfate-KCl (SUTAB) [phone] MG TABS Take 12 tablets by mouth as directed. 06/18/23   Iva Boop, MD    Current Outpatient Medications  Medication Sig Dispense Refill   CALCIUM PO Take 1 tablet by mouth daily with breakfast.      Cholecalciferol (VITAMIN D-3 PO) Take 1 capsule by mouth daily with breakfast.      colestipol (COLESTID) 1 g tablet Take 2 g by mouth daily. (Patient not taking: Reported on 06/18/2023)     diclofenac (VOLTAREN) 75 MG EC tablet TAKE 1 TABLET(75 MG) BY MOUTH TWICE DAILY 60 tablet 1   hydrochlorothiazide (HYDRODIURIL) 12.5 MG tablet Take 12.5 mg by mouth every morning.     levocetirizine (XYZAL) 5  MG tablet Take 1 tablet (5 mg total) by mouth daily as needed for allergies. (Patient not taking: Reported on 06/18/2023) 30 tablet 5   lisinopril (ZESTRIL) 10 MG tablet Take 10 mg by mouth daily.     loratadine (CLARITIN) 10 MG tablet loratadine 10 mg tablet  TAKE 1 TABLET BY MOUTH DAILY (Patient not taking: Reported on 06/18/2023)     meloxicam (MOBIC) 7.5 MG tablet Take 1 tablet (7.5 mg total) by mouth daily as needed for pain. 30 tablet 0   Multiple Vitamins-Minerals (ONE-A-DAY WOMENS PO) Take 1 tablet by mouth daily with breakfast.     rosuvastatin (CRESTOR) 10 MG tablet Take 10 mg by mouth daily.     Sodium Sulfate-Mag Sulfate-KCl (SUTAB) [phone] MG TABS Take 12 tablets by mouth as directed. 24 tablet 0    No current facility-administered medications for this visit.    Allergies as of 07/06/2023 - Review Complete 06/18/2023  Allergen Reaction Noted   Codeine Hives, Itching, and Other (See Comments) 03/31/2011   Penicillins Swelling and Other (See Comments) 09/26/2009    Family History  Problem Relation Age of Onset   Colon polyps Mother    Esophageal cancer Father    Allergic rhinitis Father    Colon polyps Father    Liver disease Father    Irritable bowel syndrome Father    Stomach cancer Maternal Aunt    Colon cancer Maternal Aunt    Breast cancer Maternal Grandmother    Diabetes Maternal Grandmother    Diabetes Maternal Grandfather    Diabetes Paternal Grandmother    Diabetes Paternal Grandfather    Migraines Daughter    Asthma Neg Hx    Eczema Neg Hx    Urticaria Neg Hx    Immunodeficiency Neg Hx    Angioedema Neg Hx    Rectal cancer Neg Hx     Social History   Socioeconomic History   Marital status: Married    Spouse name: Not on file   Number of children: 2   Years of education: Not on file   Highest education level: Not on file  Occupational History   Occupation: Event Specicalist  Tobacco Use   Smoking status: Never   Smokeless tobacco: Never  Vaping Use   Vaping status: Never Used  Substance and Sexual Activity   Alcohol use: No   Drug use: No   Sexual activity: Not on file  Other Topics Concern   Not on file  Social History Narrative   Patient is married to Poynor.  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   2 daughters   Never smoker   No drug use   No other tobacco no alcohol and no caffeine   Social Drivers of Corporate investment banker Strain: Not on file  Food Insecurity: Not on file  Transportation Needs: Not on file  Physical Activity: Not on file  Stress: Not on file  Social Connections: Not on file  Intimate Partner Violence: Not on file    Review of  Systems: Positive for *** All other review of systems negative except as mentioned in the HPI.  Physical Exam: Vital signs There were no vitals taken for this visit.  General:   Alert,  Well-developed, well-nourished, pleasant and cooperative in NAD Lungs:  Clear throughout to auscultation.   Heart:  Regular rate and rhythm; no murmurs, clicks, rubs,  or gallops. Abdomen:  Soft, nontender and nondistended.  Normal bowel sounds.   Neuro/Psych:  Alert and cooperative. Normal mood and affect. A and O x 3   @Betti Goodenow  Sena Slate, MD, [facility] [phone] (pager) 07/05/2023 6:22 PM@

## 2023-07-06 ENCOUNTER — Encounter: Payer: Self-pay | Admitting: Internal Medicine

## 2023-07-06 ENCOUNTER — Ambulatory Visit (AMBULATORY_SURGERY_CENTER): Payer: 59 | Admitting: Internal Medicine

## 2023-07-06 VITALS — BP 135/76 | HR 78 | Temp 97.4°F | Resp 24 | Ht 63.0 in | Wt 131.0 lb

## 2023-07-06 DIAGNOSIS — K5730 Diverticulosis of large intestine without perforation or abscess without bleeding: Secondary | ICD-10-CM

## 2023-07-06 DIAGNOSIS — Z800 Family history of malignant neoplasm of digestive organs: Secondary | ICD-10-CM | POA: Diagnosis not present

## 2023-07-06 DIAGNOSIS — Z83719 Family history of colon polyps, unspecified: Secondary | ICD-10-CM

## 2023-07-06 DIAGNOSIS — Z1211 Encounter for screening for malignant neoplasm of colon: Secondary | ICD-10-CM

## 2023-07-06 MED ORDER — SODIUM CHLORIDE 0.9 % IV SOLN: 500.0000 mL | Freq: Once | INTRAVENOUS | Status: DC

## 2023-07-06 NOTE — Op Note (Signed)
[facility] Patient Name: Helen Cook Procedure Date: 07/06/2023 8:08 AM MRN: [id number] Endoscopist: Iva Boop , MD, [id number] Age: 63 Referring MD:  Date of Birth: [birth year] Gender: Female Account #: [id number] Procedure:                Colonoscopy Indications:              Screening for colon cancer: Family history of                            colorectal cancer in aunt + polyps in parents Medicines:                Monitored Anesthesia Care Procedure:                Pre-Anesthesia Assessment:                           - Prior to the procedure, a History and Physical                            was performed, and patient medications and                            allergies were reviewed. The patient's tolerance of                            previous anesthesia was also reviewed. The risks                            and benefits of the procedure and the sedation                            options and risks were discussed with the patient.                            All questions were answered, and informed consent                            was obtained. Prior Anticoagulants: The patient has                            taken no anticoagulant or antiplatelet agents. ASA                            Grade Assessment: II - A patient with mild systemic                            disease. After reviewing the risks and benefits,                            the patient was deemed in satisfactory condition to                            undergo the procedure.  After obtaining informed consent, the colonoscope                            was passed under direct vision. Throughout the                            procedure, the patient's blood pressure, pulse, and                            oxygen saturations were monitored continuously. The                            Olympus Scope [id number] was introduced through the                            anus and  advanced to the the terminal ileum, with                            identification of the appendiceal orifice and IC                            valve. The colonoscopy was performed without                            difficulty. The patient tolerated the procedure                            well. The quality of the bowel preparation was                            adequate. The terminal ileum, ileocecal valve,                            appendiceal orifice, and rectum were photographed.                            The bowel preparation used was SUTAB via split dose                            instruction. Scope In: 8:15:35 AM Scope Out: 8:34:19 AM Scope Withdrawal Time: 0 hours 14 minutes 46 seconds  Total Procedure Duration: 0 hours 18 minutes 44 seconds  Findings:                 The perianal and digital rectal examinations were                            normal.                           Multiple diverticula were found in the entire colon.                           The exam was otherwise without abnormality on  direct and retroflexion views.                           The terminal ileum appeared normal. Complications:            No immediate complications. Estimated Blood Loss:     Estimated blood loss: none. Impression:               - Diverticulosis in the entire examined colon.                           - The examination was otherwise normal on direct                            and retroflexion views.                           - The examined portion of the ileum was normal.                           - No specimens collected. Recommendation:           - Patient has a contact number available for                            emergencies. The signs and symptoms of potential                            delayed complications were discussed with the                            patient. Return to normal activities tomorrow.                            Written discharge  instructions were provided to the                            patient.                           - Resume previous diet.                           - Continue present medications.                           - Repeat colonoscopy in 10 years. (had an aunt w/                            CRCA and parents w/ polyps - current guidelines =                            10 year repeat) Iva Boop, MD 07/06/2023 8:45:25 AM This report has been signed electronically.

## 2023-07-06 NOTE — Patient Instructions (Addendum)
No polyps, cancer or inflammation seen.  You do have diverticulosis - thickened muscle rings and pouches in the colon wall. Please read the handout about this condition.  Next routine colonoscopy or other screening test in 10 years - 2035.  I appreciate the opportunity to care for you. Iva Boop, MD, FACG  YOU HAD AN ENDOSCOPIC PROCEDURE TODAY AT THE [facility]:   Refer to the procedure report that was given to you for any specific questions about what was found during the examination.  If the procedure report does not answer your questions, please call your gastroenterologist to clarify.  If you requested that your care partner not be given the details of your procedure findings, then the procedure report has been included in a sealed envelope for you to review at your convenience later.  YOU SHOULD EXPECT: Some feelings of bloating in the abdomen. Passage of more gas than usual.  Walking can help get rid of the air that was put into your GI tract during the procedure and reduce the bloating. If you had a lower endoscopy (such as a colonoscopy or flexible sigmoidoscopy) you may notice spotting of blood in your stool or on the toilet paper. If you underwent a bowel prep for your procedure, you may not have a normal bowel movement for a few days.  Please Note:  You might notice some irritation and congestion in your nose or some drainage.  This is from the oxygen used during your procedure.  There is no need for concern and it should clear up in a day or so.  SYMPTOMS TO REPORT IMMEDIATELY:  Following lower endoscopy (colonoscopy or flexible sigmoidoscopy):  Excessive amounts of blood in the stool  Significant tenderness or worsening of abdominal pains  Swelling of the abdomen that is new, acute  Fever of 100F or higher  For urgent or emergent issues, a gastroenterologist can be reached at any hour by calling [phone]. Do not use MyChart messaging for urgent  concerns.    DIET:  We do recommend a small meal at first, but then you may proceed to your regular diet.  Drink plenty of fluids but you should avoid alcoholic beverages for 24 hours.  ACTIVITY:  You should plan to take it easy for the rest of today and you should NOT DRIVE or use heavy machinery until tomorrow (because of the sedation medicines used during the test).    FOLLOW UP: Our staff will call the number listed on your records the next business day following your procedure.  We will call around 7:15- 8:00 am to check on you and address any questions or concerns that you may have regarding the information given to you following your procedure. If we do not reach you, we will leave a message.     If any biopsies were taken you will be contacted by phone or by letter within the next 1-3 weeks.  Please call us at [phone] if you have not heard about the biopsies in 3 weeks.    SIGNATURES/CONFIDENTIALITY: You and/or your care partner have signed paperwork which will be entered into your electronic medical record.  These signatures attest to the fact that that the information above on your After Visit Summary has been reviewed and is understood.  Full responsibility of the confidentiality of this discharge information lies with you and/or your care-partner.

## 2023-07-06 NOTE — Progress Notes (Signed)
Report to PACU, RN, vss, BBS= Clear.

## 2023-07-06 NOTE — Progress Notes (Signed)
Pt's states no medical or surgical changes since previsit or office visit.

## 2023-07-07 ENCOUNTER — Telehealth: Payer: Self-pay

## 2023-07-07 NOTE — Telephone Encounter (Signed)
  Follow up Call-     07/06/2023    7:11 AM  Call back number  Post procedure Call Back phone  # [phone]  Permission to leave phone message Yes     Patient questions:  Do you have a fever, pain , or abdominal swelling? No. Pain Score  0 *  Have you tolerated food without any problems? Yes.    Have you been able to return to your normal activities? Yes.    Do you have any questions about your discharge instructions: Diet   No. Medications  No. Follow up visit  No.  Do you have questions or concerns about your Care? No.  Actions: * If pain score is 4 or above: No action needed, pain <4.

## 2023-08-11 ENCOUNTER — Ambulatory Visit: Payer: 59

## 2023-08-11 NOTE — Progress Notes (Signed)
 Patient presents today to pick up custom molded foot orthotics, diagnosed with PF by Dr. Al Corpus .   Orthotics were dispensed and fit was satisfactory. Reviewed instructions for break-in and wear. Written instructions given to patient.  Patient will follow up as needed.   Helen Cook CPed, CFo, CFm

## 2023-09-07 ENCOUNTER — Other Ambulatory Visit (INDEPENDENT_AMBULATORY_CARE_PROVIDER_SITE_OTHER)

## 2023-09-07 ENCOUNTER — Ambulatory Visit: Admitting: Orthopaedic Surgery

## 2023-09-07 DIAGNOSIS — G8929 Other chronic pain: Secondary | ICD-10-CM

## 2023-09-07 DIAGNOSIS — M25562 Pain in left knee: Secondary | ICD-10-CM

## 2023-09-07 DIAGNOSIS — M25551 Pain in right hip: Secondary | ICD-10-CM

## 2023-09-07 MED ORDER — NAPROXEN 500 MG PO TABS: 500.0000 mg | ORAL_TABLET | Freq: Two times a day (BID) | ORAL | 3 refills | Status: AC

## 2023-09-07 NOTE — Progress Notes (Signed)
 Office Visit Note   Patient: Helen Cook           Date of Birth: [birth year]           MRN: [id number] Visit Date: 09/07/2023              Requested by: Tarry Kos, MD [address] [place],  [place] [address] PCP: Lorenda Ishihara, MD   Assessment & Plan: Visit Diagnoses:  1. Chronic pain of left knee   2. Pain of right hip     Plan: This is a 63 year old female with right trochanteric hip pain and left knee pain.  X-rays reviewed with the patient which shows minimal arthritis.  We will start with home exercise program for the hip and the knee.  I prescribed naproxen.  Follow-up as needed.  Follow-Up Instructions: No follow-ups on file.   Orders:  Orders Placed This Encounter  Procedures   XR HIP UNILAT W OR W/O PELVIS 1V RIGHT   XR KNEE 3 VIEW LEFT   Meds ordered this encounter  Medications   naproxen (NAPROSYN) 500 MG tablet    Sig: Take 1 tablet (500 mg total) by mouth 2 (two) times daily with a meal.    Dispense:  30 tablet    Refill:  3      Procedures: No procedures performed   Clinical Data: No additional findings.   Subjective: Chief Complaint  Patient presents with   Left Knee - Pain   Right Hip - Pain    HPI Helen Cook is a 63 year old female here for evaluation of right hip pain and left knee pain.  For the right hip she has some groin pain but mainly lateral stabbing pain that is worse with start up pain and stiffness.  Rarely groin pain.  In regards to the left knee she feels pain around the patella.  Denies any swelling. Review of Systems  Constitutional: Negative.   HENT: Negative.    Eyes: Negative.   Respiratory: Negative.    Cardiovascular: Negative.   Endocrine: Negative.   Musculoskeletal: Negative.   Neurological: Negative.   Hematological: Negative.   Psychiatric/Behavioral: Negative.    All other systems reviewed and are negative.    Objective: Vital Signs: There were no vitals taken for this  visit.  Physical Exam Vitals and nursing note reviewed.  Constitutional:      Appearance: She is well-developed.  HENT:     Head: Atraumatic.     Nose: Nose normal.  Eyes:     Extraocular Movements: Extraocular movements intact.  Cardiovascular:     Pulses: Normal pulses.  Pulmonary:     Effort: Pulmonary effort is normal.  Abdominal:     Palpations: Abdomen is soft.  Musculoskeletal:     Cervical back: Neck supple.  Skin:    General: Skin is warm.     Capillary Refill: Capillary refill takes less than 2 seconds.  Neurological:     Mental Status: She is alert. Mental status is at baseline.  Psychiatric:        Behavior: Behavior normal.        Thought Content: Thought content normal.        Judgment: Judgment normal.     Ortho Exam Exam of the right hip shows no trochanteric tenderness.  She has normal range of motion.  Negative logroll.  Negative FADIR.  Negative Stinchfield.  Exam of the left knee is unremarkable. Specialty Comments:  No specialty comments available.  Imaging: XR  HIP UNILAT W OR W/O PELVIS 1V RIGHT Result Date: 09/07/2023 X-rays of the pelvis showed minimal osteoarthritis of the hip joints.  No acute or structural abnormalities.  XR KNEE 3 VIEW LEFT Result Date: 09/07/2023 X-rays of the left knee show no acute or structural abnormalities.    PMFS History: Patient Active Problem List   Diagnosis Date Noted   Increased frequency of urination 02/18/2018   Elevated liver function tests 02/17/2018   RUQ pain 02/17/2018   Seasonal and perennial allergic rhinitis 09/16/2017   Allergic conjunctivitis 09/16/2017   Seasonal and perennial allergic rhinitis 09/16/2017   Insomnia 09/24/2016   IRRITABLE BOWEL SYNDROME 09/26/2009   LIVER FUNCTION TESTS, ABNORMAL, HX OF 09/26/2009   Past Medical History:  Diagnosis Date   Abdominal pain, RUQ    RUQ and also radiates to the center of abdomen    Allergy    Arthritis    hip   Cataract     Hyperlipidemia    Hypertension    IBS (irritable bowel syndrome)     Family History  Problem Relation Age of Onset   Colon polyps Mother    Esophageal cancer Father    Allergic rhinitis Father    Colon polyps Father    Liver disease Father    Irritable bowel syndrome Father    Stomach cancer Maternal Aunt    Colon cancer Maternal Aunt    Breast cancer Maternal Grandmother    Diabetes Maternal Grandmother    Diabetes Maternal Grandfather    Diabetes Paternal Grandmother    Diabetes Paternal Grandfather    Migraines Daughter    Asthma Neg Hx    Eczema Neg Hx    Urticaria Neg Hx    Immunodeficiency Neg Hx    Angioedema Neg Hx    Rectal cancer Neg Hx     Past Surgical History:  Procedure Laterality Date   COLONOSCOPY  2018   DILATION AND CURETTAGE OF UTERUS     ESOPHAGOGASTRODUODENOSCOPY  2012   NOVASURE ABLATION  05/22/10   POLYPECTOMY     TONSILLECTOMY     UPPER GASTROINTESTINAL ENDOSCOPY     WISDOM TOOTH EXTRACTION     Social History   Occupational History   Occupation: Proofreader  Tobacco Use   Smoking status: Never   Smokeless tobacco: Never  Vaping Use   Vaping status: Never Used  Substance and Sexual Activity   Alcohol use: No   Drug use: No   Sexual activity: Not on file

## 2024-03-14 ENCOUNTER — Other Ambulatory Visit: Payer: Self-pay | Admitting: Podiatry

## 2024-04-10 ENCOUNTER — Encounter: Payer: Self-pay | Admitting: Radiology
